# Patient Record
Sex: Female | Born: 1990 | Race: White | Hispanic: No | Marital: Married | Smoking: Current every day smoker
Health system: Southern US, Community
[De-identification: ages and names within clinical notes are randomized; demographics above are authoritative.]

## PROBLEM LIST (undated history)

## (undated) DIAGNOSIS — F419 Anxiety disorder, unspecified: Secondary | ICD-10-CM

## (undated) DIAGNOSIS — G35 Multiple sclerosis: Secondary | ICD-10-CM

## (undated) DIAGNOSIS — F429 Obsessive-compulsive disorder, unspecified: Secondary | ICD-10-CM

## (undated) DIAGNOSIS — M419 Scoliosis, unspecified: Secondary | ICD-10-CM

## (undated) HISTORY — PX: BREAST ENHANCEMENT SURGERY: SHX7

## (undated) HISTORY — DX: Anxiety disorder, unspecified: F41.9

## (undated) HISTORY — DX: Scoliosis, unspecified: M41.9

## (undated) HISTORY — DX: Obsessive-compulsive disorder, unspecified: F42.9

## (undated) HISTORY — DX: Multiple sclerosis: G35

---

## 2010-05-08 HISTORY — PX: BREAST ENHANCEMENT SURGERY: SHX7

## 2011-01-27 ENCOUNTER — Inpatient Hospital Stay (HOSPITAL_COMMUNITY)
Admission: AD | Admit: 2011-01-27 | Discharge: 2011-01-27 | Disposition: A | Discharge status: Home / Self Care | Payer: Self-pay | Source: Ambulatory Visit | Attending: Obstetrics and Gynecology | Admitting: Obstetrics and Gynecology

## 2011-01-27 ENCOUNTER — Encounter: Payer: Self-pay | Admitting: Emergency Medicine

## 2011-01-27 ENCOUNTER — Emergency Department (HOSPITAL_BASED_OUTPATIENT_CLINIC_OR_DEPARTMENT_OTHER)
Admission: EM | Admit: 2011-01-27 | Discharge: 2011-01-27 | Disposition: A | Discharge status: Other Acute Inpatient Hospital | Payer: Self-pay | Attending: Emergency Medicine | Admitting: Emergency Medicine

## 2011-01-27 ENCOUNTER — Inpatient Hospital Stay (HOSPITAL_COMMUNITY): Payer: Self-pay

## 2011-01-27 DIAGNOSIS — R102 Pelvic and perineal pain: Secondary | ICD-10-CM

## 2011-01-27 DIAGNOSIS — J45909 Unspecified asthma, uncomplicated: Secondary | ICD-10-CM | POA: Insufficient documentation

## 2011-01-27 DIAGNOSIS — N83209 Unspecified ovarian cyst, unspecified side: Secondary | ICD-10-CM

## 2011-01-27 DIAGNOSIS — F17200 Nicotine dependence, unspecified, uncomplicated: Secondary | ICD-10-CM | POA: Insufficient documentation

## 2011-01-27 DIAGNOSIS — N949 Unspecified condition associated with female genital organs and menstrual cycle: Secondary | ICD-10-CM | POA: Insufficient documentation

## 2011-01-27 DIAGNOSIS — N83201 Unspecified ovarian cyst, right side: Secondary | ICD-10-CM | POA: Diagnosis present

## 2011-01-27 DIAGNOSIS — R109 Unspecified abdominal pain: Secondary | ICD-10-CM | POA: Insufficient documentation

## 2011-01-27 LAB — BASIC METABOLIC PANEL
BUN: 11 mg/dL (ref 6–23)
CO2: 28 mEq/L (ref 19–32)
Calcium: 9.3 mg/dL (ref 8.4–10.5)
Chloride: 102 mEq/L (ref 96–112)
Creatinine, Ser: 0.5 mg/dL (ref 0.50–1.10)
GFR calc Af Amer: >60 mL/min (ref >60)
GFR calc non Af Amer: >60 mL/min (ref >60)
Glucose, Bld: 87 mg/dL (ref 70–99)
Potassium: 4 mEq/L (ref 3.5–5.1)
Sodium: 137 mEq/L (ref 135–145)

## 2011-01-27 LAB — WET PREP, GENITAL
Clue Cells Wet Prep HPF POC: NONE SEEN
Trich, Wet Prep: NONE SEEN
Yeast Wet Prep HPF POC: NONE SEEN

## 2011-01-27 LAB — CBC
HCT: 38.2 % (ref 36.0–46.0)
Hemoglobin: 13.1 g/dL (ref 12.0–15.0)
MCH: 30.1 pg (ref 26.0–34.0)
MCHC: 34.3 g/dL (ref 30.0–36.0)
MCV: 87.8 fL (ref 78.0–100.0)
Platelets: 200 10*3/uL (ref 150–400)
RBC: 4.35 MIL/uL (ref 3.87–5.11)
RDW: 11.9 % (ref 11.5–15.5)
WBC: 10.3 10*3/uL (ref 4.0–10.5)

## 2011-01-27 LAB — URINALYSIS, ROUTINE W REFLEX MICROSCOPIC
Bilirubin Urine: NEGATIVE
Glucose, UA: NEGATIVE mg/dL
Hgb urine dipstick: NEGATIVE
Ketones, ur: NEGATIVE mg/dL
Leukocytes, UA: NEGATIVE
Nitrite: NEGATIVE
Protein, ur: NEGATIVE mg/dL
Specific Gravity, Urine: 1.025 (ref 1.005–1.030)
Urobilinogen, UA: 0.2 mg/dL (ref 0.0–1.0)
pH: 6 (ref 5.0–8.0)

## 2011-01-27 LAB — PREGNANCY, URINE: Preg Test, Ur: NEGATIVE

## 2011-01-27 MED ORDER — OXYCODONE-ACETAMINOPHEN 5-325 MG PO TABS: 1.0000 | ORAL_TABLET | Freq: Four times a day (QID) | ORAL | Status: AC | PRN

## 2011-01-27 MED ORDER — IBUPROFEN 200 MG PO TABS: 800.0000 mg | ORAL_TABLET | Freq: Three times a day (TID) | ORAL | Status: DC | PRN

## 2011-01-27 MED ORDER — OXYCODONE-ACETAMINOPHEN 5-325 MG PO TABS
1.0000 | ORAL_TABLET | Freq: Once | ORAL | Status: AC
  Administered 2011-01-27: 1 via ORAL
  Filled 2011-01-27: qty 1

## 2011-01-27 NOTE — ED Provider Notes (Signed)
Pt sent from [facility] by Dr. Karma Ganja for u/s. Was fully evaluated in ED there. Requested u/s to r/o torsion, no u/s available there overnight. Pt presents with acute onset of right sided pelvic pain which awoke her out of sleep tonight approx 2am. Pain was initially 10/10, took to advil and pain has decreased to 6/10. No fever, no vomiting. Had not been having any pain prior to tonight. Denies vaginal discharge, no vaginal bleeding. No dysuria. Pain radiates to right lower back. Pain has decreased, but comes in waves. LMP "about 1 month ago", periods irregular, stopped OCPs about 1.5 months ago.   O: VSS, no acute distress, A&O x 4, MSE complete  US Transvaginal Non-ob  01/27/2011  *RADIOLOGY REPORT*  Clinical Data: Right adnexal tenderness.  TRANSABDOMINAL AND TRANSVAGINAL ULTRASOUND OF PELVIS Technique:  Both transabdominal and transvaginal ultrasound examinations of the pelvis were performed. Transabdominal technique was performed for global imaging of the pelvis including uterus, ovaries, adnexal regions, and pelvic cul-de-sac.  Comparison: None.   It was necessary to proceed with endovaginal exam following the transabdominal exam to visualize the endometrium and adnexa.  Findings:  Uterus: Normal sonographic appearance, measuring 7.8 x 3.3 x 4.4 centimeters.  Endometrium: Normal sonographic appearance, measuring 11 mm in thickness.  Right ovary:  Measures 6.4 x 3.3 x 3.7 cm, containing a 4.1 x 2.8 x 3.4 cm cyst with internal lace-like echoes. Otherwise normal sonographic appearance.  Left ovary: Normal sonographic appearance, measuring 4.2 x 1.6 x 2.4 cm.  Other findings: There is a small amount of free fluid within the right adnexa and pelvis.  IMPRESSION: 4.1 cm right ovarian hemorrhagic cyst, with a small amount of free fluid within the right adnexa and pelvis.  Original Report Authenticated By: Waneta Martins, M.D.   US Pelvis Complete  01/27/2011  *RADIOLOGY REPORT*  Clinical Data:  Right adnexal tenderness.  TRANSABDOMINAL AND TRANSVAGINAL ULTRASOUND OF PELVIS Technique:  Both transabdominal and transvaginal ultrasound examinations of the pelvis were performed. Transabdominal technique was performed for global imaging of the pelvis including uterus, ovaries, adnexal regions, and pelvic cul-de-sac.  Comparison: None.   It was necessary to proceed with endovaginal exam following the transabdominal exam to visualize the endometrium and adnexa.  Findings:  Uterus: Normal sonographic appearance, measuring 7.8 x 3.3 x 4.4 centimeters.  Endometrium: Normal sonographic appearance, measuring 11 mm in thickness.  Right ovary:  Measures 6.4 x 3.3 x 3.7 cm, containing a 4.1 x 2.8 x 3.4 cm cyst with internal lace-like echoes. Otherwise normal sonographic appearance.  Left ovary: Normal sonographic appearance, measuring 4.2 x 1.6 x 2.4 cm.  Other findings: There is a small amount of free fluid within the right adnexa and pelvis.  IMPRESSION: 4.1 cm right ovarian hemorrhagic cyst, with a small amount of free fluid within the right adnexa and pelvis.  Original Report Authenticated By: Waneta Martins, M.D.    A/P: Right ovarian cyst - 4 cm F/U in GYN clinic in 4-6 weeks, repeat u/s in 6-8 weeks per Dr. Jolayne Panther Rev'd precautions Rx Percocet and Motrin

## 2011-01-27 NOTE — Progress Notes (Signed)
Pt woke up with sharp stabbing RLQ pain this am.  Pt was seen at [facility] and sent to [facility] for U/S.

## 2011-01-27 NOTE — ED Notes (Signed)
Pt reports awaking with RLQ abd pain radiating to right flank. Denies n/v/d.

## 2011-01-27 NOTE — ED Provider Notes (Addendum)
History     CSN: [id number] Arrival date & time: 01/27/2011  3:48 AM  Chief Complaint  Patient presents with  . Abdominal Cramping    HPI  (Consider location/radiation/quality/duration/timing/severity/associated sxs/prior treatment)  HPI Pt presents with acute onset of right sided pelvic pain which awoke her out of sleep tonight approx 2am. Pain was initially 10/10, took to advil and pain has decreased to 6/10.  No fever, no vomiting.  Had not been having any pain prior to tonight.  Denies vaginal discharge, no vaginal bleeding. No dysuria.  Pain radiates to right lower back.   Past Medical History  Diagnosis Date  . Asthma     History reviewed. No pertinent past surgical history.  No family history on file.  History  Substance Use Topics  . Smoking status: Current Everyday Smoker  . Smokeless tobacco: Not on file  . Alcohol Use: No    OB History    Grav Para Term Preterm Abortions TAB SAB Ect Mult Living                  Review of Systems  Review of Systems ROS reviewed and otherwise negative except for mentioned in HPI   Allergies  Review of patient's allergies indicates no known allergies.  Home Medications  No current outpatient prescriptions on file.  Physical Exam    BP 115/79  Pulse 83  Temp(Src) 98 F (36.7 C) (Oral)  Resp 18  SpO2 100%  LMP 12/27/2010 Vital signs reviewed by me Physical Exam Physical Examination: General appearance - alert, well appearing, and in no distress Mental status - alert, oriented to person, place, and time Eyes - no conjunctival injection Chest - clear to auscultation, no wheezes, rales or rhonchi, symmetric air entry Heart - normal rate, regular rhythm, normal S1, S2, no murmurs, rubs, clicks or gallops Abdomen - soft, nondistended, ttp in right pelvis, normal active bowel sounds, no rebound tenderness noted bowel sounds normal  Pelvic - VULVA: normal appearing vulva with no masses, tenderness or lesions, VAGINA:  normal appearing vagina with normal color. Scant white vaginal discharge, no lesions, CERVIX: normal appearing cervix without discharge or lesions, UTERUS: uterus is normal size, shape, consistency and nontender, ADNEXA: normal adnexa in size, nontender and no masses except adnexal tenderness right Neurological - alert, oriented, normal speech, no focal findings or movement disorder noted Extremities - peripheral pulses normal, no pedal edema, no clubbing or cyanosis Skin - normal coloration and turgor, no rashes, no suspicious skin lesions noted   ED Course  Procedures (including critical care time)   Labs Reviewed  URINALYSIS, ROUTINE W REFLEX MICROSCOPIC  PREGNANCY, URINE   No results found. CBC, chem 7, wet prep all obtained and reassuring   No diagnosis found.   MDM Pt with acute onset of right pelvic pain, some right adnexal tenderness on pelvic exam.  Pain was 10/10 at onset, now 3/10 after ibuprofen at home.  Urine reveals no signs of UTI, no RBCs to suggest ureteral stone, upreg negative.  Pt will need pelvic ultrasound to r/o torsion.  Discussed with patient and they are agreeable with plan.  Contacting women[facility] now to arrange for patient to go for ultrasound- as not available in this ED overnight 4:37 AM    Contacted [facility], they advise [facility] to go to [facility], sign in at registration desk for ultrasound. Spoke to Press photographer, Dorene Grebe.  Pt agreeble with this plan and is stable for transfer. GC/Chlamydia pending.   Ethelda Chick, MD  01/27/11 0440  Ethelda Chick, MD 01/27/11 [phone]

## 2011-01-28 LAB — GC/CHLAMYDIA PROBE AMP, GENITAL
Chlamydia, DNA Probe: NEGATIVE
GC Probe Amp, Genital: NEGATIVE

## 2011-02-01 NOTE — ED Provider Notes (Signed)
Agree with above note.  Paige Davis 02/01/2011 9:10 AM

## 2011-03-09 ENCOUNTER — Encounter: Payer: Self-pay | Admitting: Obstetrics & Gynecology

## 2012-01-20 IMAGING — US US PELVIS COMPLETE
1 series · 14 of 25 positions shown · non-contrast
Comparison: None.

CLINICAL DATA: Right adnexal tenderness.

TRANSABDOMINAL AND TRANSVAGINAL ULTRASOUND OF PELVIS
TECHNIQUE: Both transabdominal and transvaginal ultrasound
examinations of the pelvis were performed. Transabdominal technique
was performed for global imaging of the pelvis including uterus,
ovaries, adnexal regions, and pelvic cul-de-sac.

[Series 1: us pelvis complete · 14 of 65 slices shown]
[im 1/65]
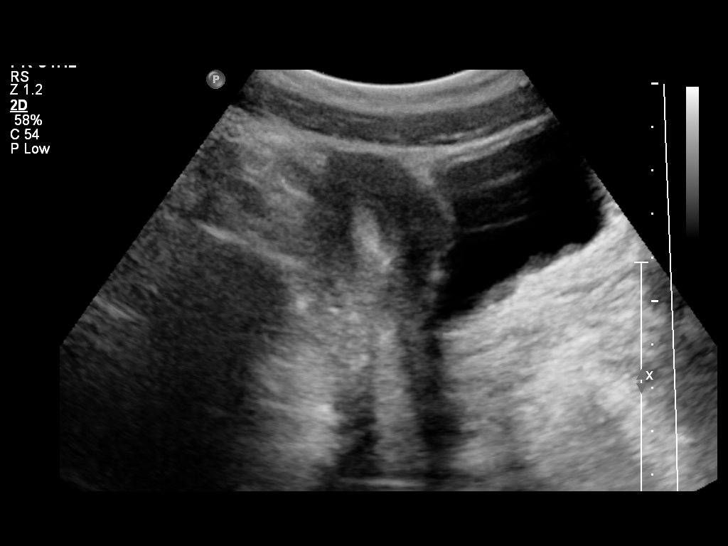
[im 6/65]
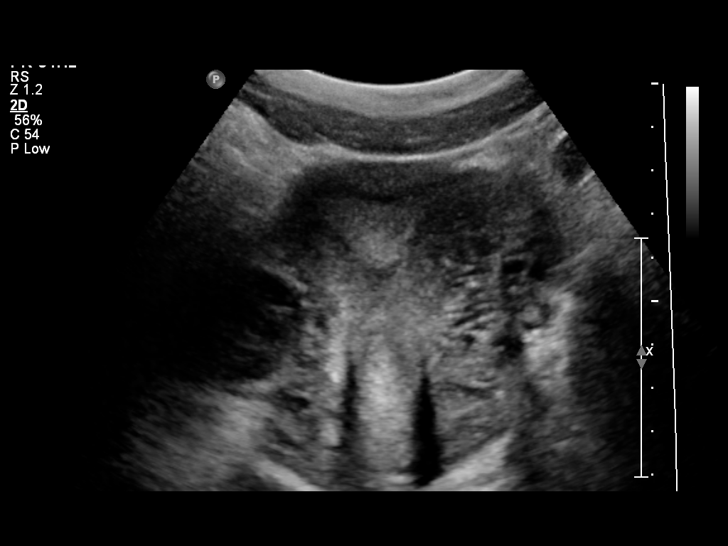
[im 11/65]
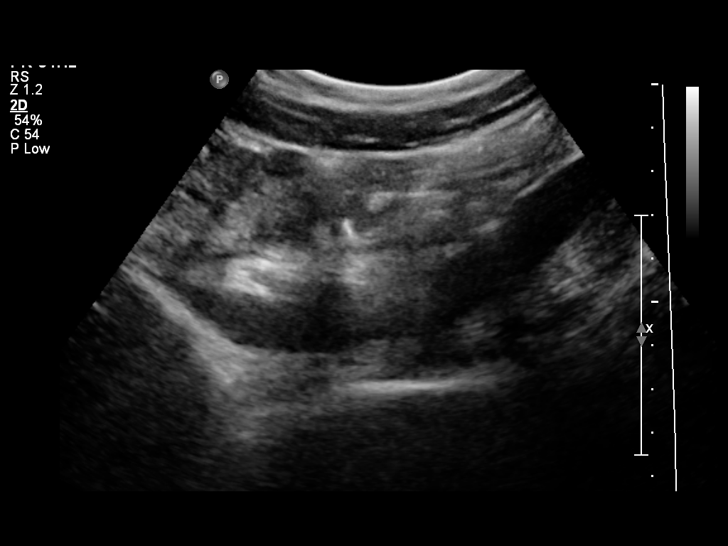
[im 17/65]
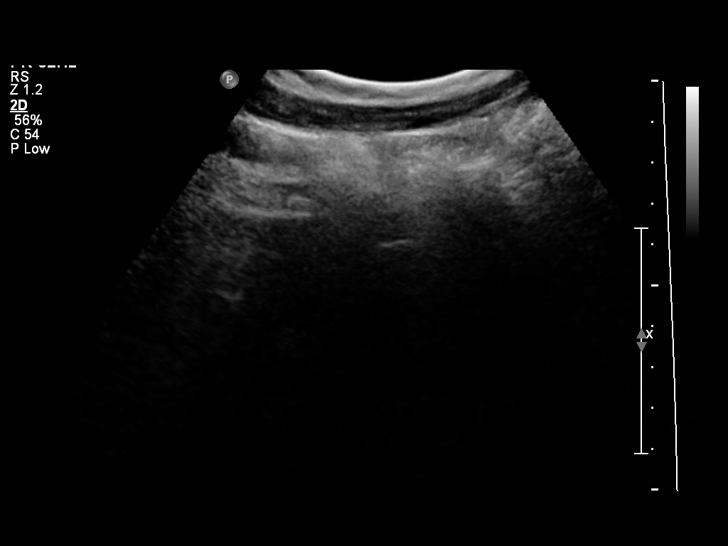
[im 22/65]
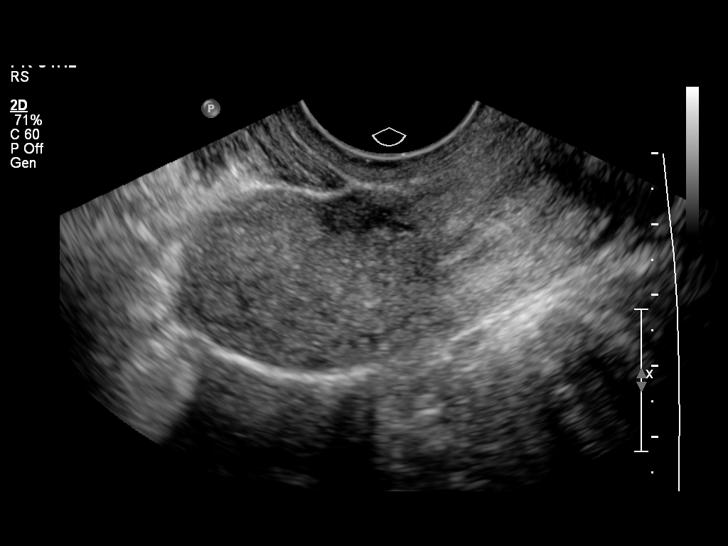
[im 25/65]
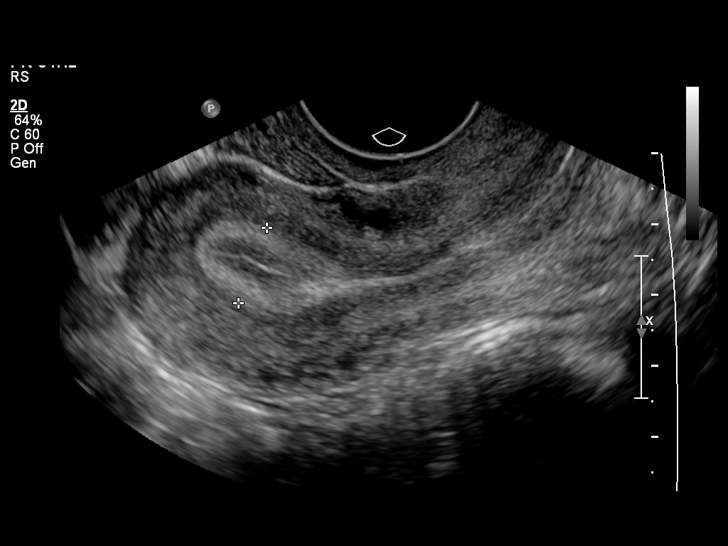
[im 30/65]
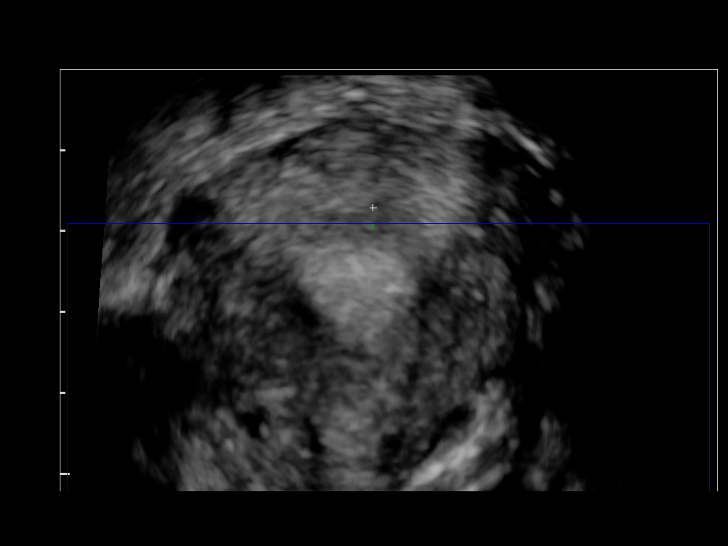
[im 35/65]
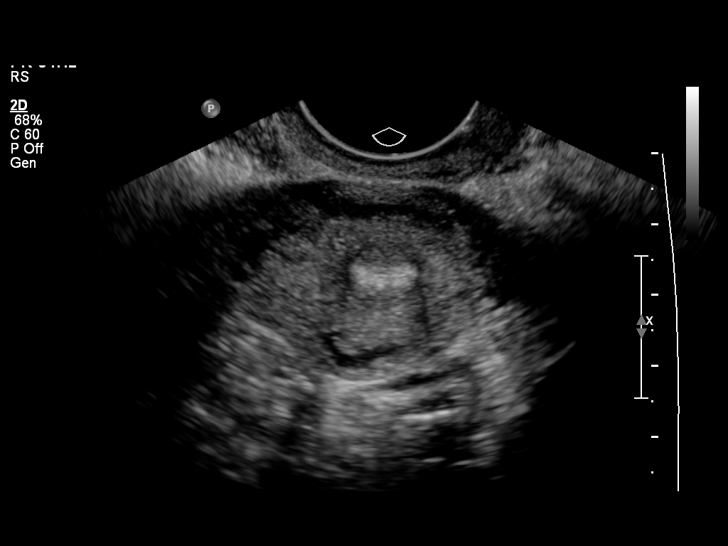
[im 41/65]
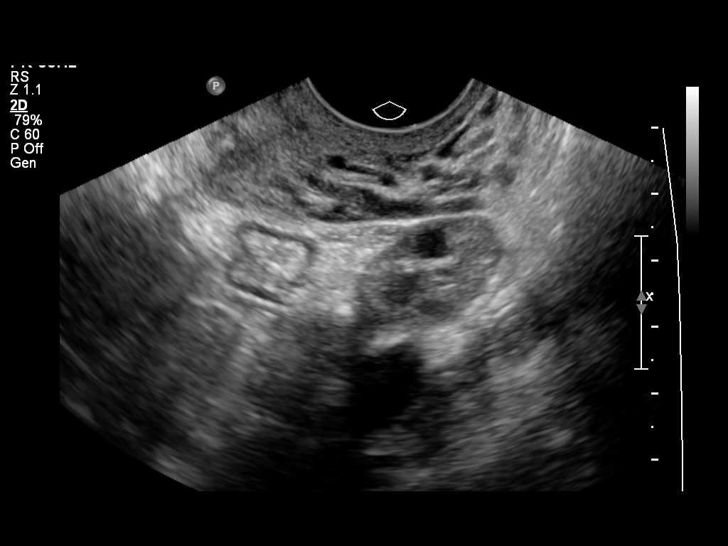
[im 43/65]
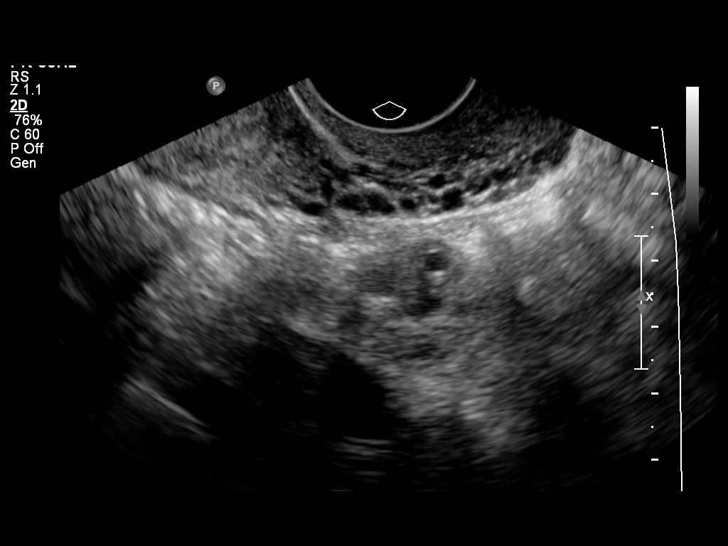
[im 49/65]
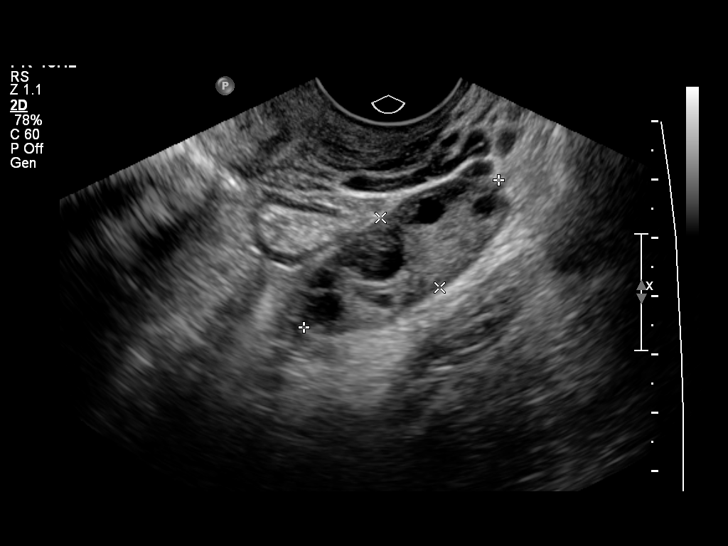
[im 54/65]
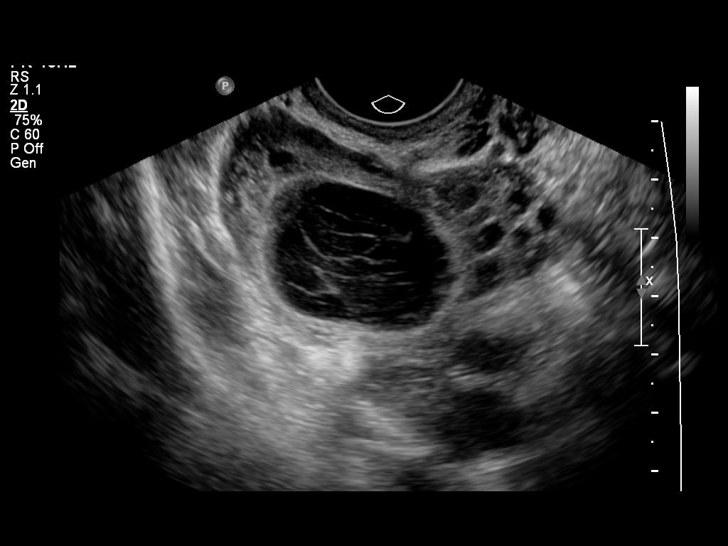
[im 59/65]
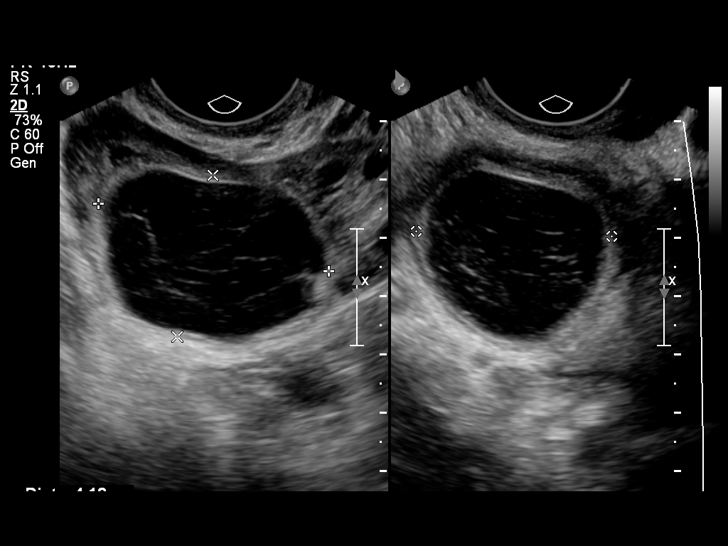
[im 65/65]
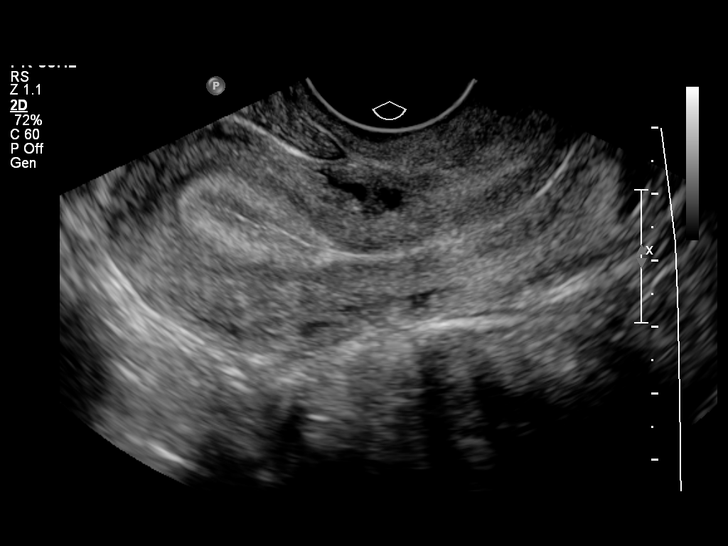

[14 of 25 positions shown; findings below may reference images not displayed]

It was necessary to proceed with endovaginal exam following the
transabdominal exam to visualize the endometrium and adnexa.
FINDINGS: Uterus: Normal sonographic appearance, measuring 7.8 x 3.3 x
centimeters.

Endometrium: Normal sonographic appearance, measuring 11 mm in
thickness.

Right ovary:  Measures 6.4 x 3.3 x 3.7 cm, containing a 4.1 x 2.8 x
3.4 cm cyst with internal lace-like echoes. Otherwise normal
sonographic appearance.

Left ovary: Normal sonographic appearance, measuring 4.2 x 1.6 x
2.4 cm.

Other findings: There is a small amount of free fluid within the
right adnexa and pelvis.
IMPRESSION: 4.1 cm right ovarian hemorrhagic cyst, with a small amount of free
fluid within the right adnexa and pelvis.

## 2016-12-24 ENCOUNTER — Emergency Department (HOSPITAL_BASED_OUTPATIENT_CLINIC_OR_DEPARTMENT_OTHER): Payer: Self-pay

## 2016-12-24 ENCOUNTER — Emergency Department (HOSPITAL_BASED_OUTPATIENT_CLINIC_OR_DEPARTMENT_OTHER)
Admission: EM | Admit: 2016-12-24 | Discharge: 2016-12-24 | Disposition: A | Discharge status: Home / Self Care | Payer: Self-pay | Attending: Emergency Medicine | Admitting: Emergency Medicine

## 2016-12-24 ENCOUNTER — Encounter (HOSPITAL_BASED_OUTPATIENT_CLINIC_OR_DEPARTMENT_OTHER): Payer: Self-pay | Admitting: Emergency Medicine

## 2016-12-24 DIAGNOSIS — F17210 Nicotine dependence, cigarettes, uncomplicated: Secondary | ICD-10-CM | POA: Insufficient documentation

## 2016-12-24 DIAGNOSIS — Y9289 Other specified places as the place of occurrence of the external cause: Secondary | ICD-10-CM | POA: Insufficient documentation

## 2016-12-24 DIAGNOSIS — Y93K1 Activity, walking an animal: Secondary | ICD-10-CM | POA: Insufficient documentation

## 2016-12-24 DIAGNOSIS — J45909 Unspecified asthma, uncomplicated: Secondary | ICD-10-CM | POA: Insufficient documentation

## 2016-12-24 DIAGNOSIS — W1842XA Slipping, tripping and stumbling without falling due to stepping into hole or opening, initial encounter: Secondary | ICD-10-CM | POA: Insufficient documentation

## 2016-12-24 DIAGNOSIS — S93491A Sprain of other ligament of right ankle, initial encounter: Secondary | ICD-10-CM | POA: Insufficient documentation

## 2016-12-24 DIAGNOSIS — Y999 Unspecified external cause status: Secondary | ICD-10-CM | POA: Insufficient documentation

## 2016-12-24 DIAGNOSIS — Z79899 Other long term (current) drug therapy: Secondary | ICD-10-CM | POA: Insufficient documentation

## 2016-12-24 MED ORDER — MELOXICAM 15 MG PO TABS: 15.0000 mg | ORAL_TABLET | Freq: Every day | ORAL | 0 refills | Status: DC

## 2016-12-24 NOTE — Discharge Instructions (Signed)
Contact a health care provider if: °You have rapidly increasing bruising or swelling. °Your pain is not relieved with medicine. °Get help right away if: °Your toes or foot becomes numb or blue. °You have severe pain that gets worse. °

## 2016-12-24 NOTE — ED Triage Notes (Signed)
Patient states that he dog got out and she was "emotionally walking"  - the patient reports that she tripped and fell on Thursday. Large amount of bruising and swelling noted to her right ankle

## 2016-12-24 NOTE — ED Notes (Signed)
EDPA into room, prior to RN assessment, see PA notes, orders received and intiated.   Alert, NAD, calm, interactive, resps e/u, speaking in clear complete sentences, no dyspnea noted, skin W&D, VSS, c/o R ankle pain swelling and bruising, "pain minimal at this time", some numbness and tingling in toes, CMS/skin intact, ROM limited d/t swelling and pain, (denies: need for pain med, sob, nausea, dizziness. Family at [facility]

## 2016-12-24 NOTE — ED Provider Notes (Signed)
[facility] Provider Note   CSN: [id number] Arrival date & time: 12/24/16  1833     History   Chief Complaint Chief Complaint  Patient presents with  . Ankle Pain    HPI Paige Davis is a 26 y.o. female who presents emergency Department with chief complaint of ankle injury. Patient states that her dog when she chased after him in the dark. She stepped into a hole with her right foot and twisted her ankle. This happened 2 days ago. Patient states that she did buy some crutches for help with ambulation, but was able to apply pressure to the ankle. Today, she did not use her crutches and her ankle became extremely bruised, swollen and painful, painful. She came in for evaluation. She denies numbness or tingling in the toes, pain in the right lower extremity or knee. She did not hit her head or lose consciousness.  HPI  Past Medical History:  Diagnosis Date  . Asthma     Patient Active Problem List   Diagnosis Date Noted  . Right ovarian cyst 01/27/2011    History reviewed. No pertinent surgical history.  OB History    No data available       Home Medications    Prior to Admission medications   Medication Sig Start Date End Date Taking? Authorizing Provider  ibuprofen (ADVIL,MOTRIN) 200 MG tablet Take 4 tablets (800 mg total) by mouth every 8 (eight) hours as needed for pain. For pain 01/27/11   Archie Patten, CNM    Family History History reviewed. No pertinent family history.  Social History Social History  Substance Use Topics  . Smoking status: Current Every Day Smoker  . Smokeless tobacco: Never Used  . Alcohol use No     Allergies   Patient has no known allergies.   Review of Systems Review of Systems Ten systems reviewed and are negative for acute change, except as noted in the HPI.    Physical Exam Updated Vital Signs BP 119/68 (BP Location: Right Arm)   Pulse 80   Temp 98 F (36.7 C) (Oral)   Resp 16   Ht 5\' 9"  (1.753  m)   Wt 68 kg (150 lb)   LMP 12/24/2016   SpO2 97%   BMI 22.15 kg/m   Physical Exam  Constitutional: She is oriented to person, place, and time. She appears well-developed and well-nourished. No distress.  HENT:  Head: Normocephalic and atraumatic.  Eyes: Conjunctivae are normal. No scleral icterus.  Neck: Normal range of motion.  Cardiovascular: Normal rate, regular rhythm and normal heart sounds.  Exam reveals no gallop and no friction rub.   No murmur heard. Pulmonary/Chest: Effort normal and breath sounds normal. No respiratory distress.  Abdominal: Soft. Bowel sounds are normal. She exhibits no distension and no mass. There is no tenderness. There is no guarding.  Musculoskeletal:  Patient with extensive bruising, swelling and tenderness posterior to the right malleolus. No bony tenderness along the foot. Able to wiggle toes. No medial ankle tenderness. Normal pulses and sensation.  Neurological: She is alert and oriented to person, place, and time.  Skin: Skin is warm and dry. She is not diaphoretic.  Psychiatric: Her behavior is normal.  Nursing note and vitals reviewed.    ED Treatments / Results  Labs (all labs ordered are listed, but only abnormal results are displayed) Labs Reviewed - No data to display  EKG  EKG Interpretation None       Radiology Dg  Ankle Complete Right  Result Date: 12/24/2016 CLINICAL DATA:  Fall 2 days ago with persistent ankle pain, initial encounter EXAM: RIGHT ANKLE - COMPLETE 3+ VIEW COMPARISON:  None. FINDINGS: No acute fracture or dislocation is noted. Lateral soft tissue swelling is seen. IMPRESSION: Soft tissue swelling without acute bony abnormality. Electronically Signed   By: Alcide Clever M.D.   On: 12/24/2016 19:11    Procedures Procedures (including critical care time)  Medications Ordered in ED Medications - No data to display   Initial Impression / Assessment and Plan / ED Course  I have reviewed the triage vital  signs and the nursing notes.  Pertinent labs & imaging results that were available during my care of the patient were reviewed by me and considered in my medical decision making (see chart for details).     Paige Davis is a 26 y.o. female who presents to ED for R ankle pain after mechanical fall. LE NVI. Exam c/w ankle sprain. X-ray negative for acute injury. Cam walker brace and crutches provided in ED. Home care instructions including RICE and NSAID's discussed. Follow up with sports medicine/ ortho if symptoms not improving in 1 week. All questions answered.    Final Clinical Impressions(s) / ED Diagnoses   Final diagnoses:  Sprain of posterior talofibular ligament of right ankle, initial encounter    New Prescriptions New Prescriptions   No medications on file     Delos Haring 12/24/16 2100    Mesner, Barbara Cower, MD 12/24/16 2336

## 2019-07-07 DIAGNOSIS — N3289 Other specified disorders of bladder: Secondary | ICD-10-CM

## 2019-07-07 HISTORY — DX: Other specified disorders of bladder: N32.89

## 2020-02-16 ENCOUNTER — Encounter: Payer: Self-pay | Admitting: Diagnostic Neuroimaging

## 2020-02-16 ENCOUNTER — Ambulatory Visit (INDEPENDENT_AMBULATORY_CARE_PROVIDER_SITE_OTHER): Payer: No Typology Code available for payment source | Admitting: Diagnostic Neuroimaging

## 2020-02-16 ENCOUNTER — Other Ambulatory Visit: Payer: Self-pay

## 2020-02-16 VITALS — BP 119/83 | HR 103 | Ht 68.0 in | Wt 144.2 lb

## 2020-02-16 DIAGNOSIS — M6281 Muscle weakness (generalized): Secondary | ICD-10-CM | POA: Diagnosis not present

## 2020-02-16 NOTE — Patient Instructions (Signed)
-   check MRI cervical / lumbar spine - check labs

## 2020-02-16 NOTE — Progress Notes (Signed)
[facility]  PATIENT: Paige Davis DOB: 1990-07-13  REFERRING CLINICIAN: Lucia Bitter, MD HISTORY FROM: patient  REASON FOR VISIT: new consult    HISTORICAL  CHIEF COMPLAINT:  Chief Complaint  Patient presents with  . Bilateral leg pain, weakness    rm 6 New Pt  "since March 2021- my legs don't work, lock up, give out, get weak; fingers/toes go numb; bladder spasms"    HISTORY OF PRESENT ILLNESS:   29 year old female here for evaluation of lower extremity weakness.  March 2021 patient was having some difficulty initiating urination, diagnosed with UTIs and treated. She was also having generalized fatigue, muscle weakness in legs, aching sensation in legs. Patient got married in April 2021 and symptoms continued. She was having continuing weakness in the legs and gait difficulty. She was having intermittent numbness in fingers and toes.  Patient went to chiropractor and had some treatments. Patient referred here for further neurologic testing and evaluation.   REVIEW OF SYSTEMS: Full 14 system review of systems performed and negative with exception of: As per HPI.  ALLERGIES: No Known Allergies  HOME MEDICATIONS: Outpatient Medications Prior to Visit  Medication Sig Dispense Refill  . acetaminophen (TYLENOL) 325 MG tablet Take 650 mg by mouth every 6 (six) hours as needed.    . ALPRAZolam (XANAX) 0.5 MG tablet Take 0.5 mg by mouth as needed for anxiety.    . Homeopathic Products (AZO CONFIDENCE PO) Take by mouth.    . Ibuprofen-diphenhydrAMINE Cit (ADVIL PM PO) Take by mouth.    . Multiple Vitamin (MULTIVITAMIN) tablet Take 1 tablet by mouth daily.    . Naproxen Sodium (ALEVE PO) Take by mouth.    . Probiotic Product (PROBIOTIC DAILY PO) Take by mouth.    . meloxicam (MOBIC) 15 MG tablet Take 1 tablet (15 mg total) by mouth daily. Take 1 daily with food. (Patient not taking: Reported on 02/16/2020) 10 tablet 0  . ibuprofen (ADVIL,MOTRIN) 200  MG tablet Take 4 tablets (800 mg total) by mouth every 8 (eight) hours as needed for pain. For pain 30 tablet 1   No facility-administered medications prior to visit.    PAST MEDICAL HISTORY: Past Medical History:  Diagnosis Date  . Anxiety   . Asthma   . Bladder spasms 07/2019  . Scoliosis     PAST SURGICAL HISTORY: Past Surgical History:  Procedure Laterality Date  . BREAST ENHANCEMENT SURGERY      FAMILY HISTORY: Family History  Problem Relation Age of Onset  . Arthritis Mother     SOCIAL HISTORY: Social History   Socioeconomic History  . Marital status: Married    Spouse name: Arlys John  . Number of children: 0  . Years of education: Not on file  . Highest education level: GED or equivalent  Occupational History    Comment: real estate agent  Tobacco Use  . Smoking status: Current Every Day Smoker    Years: 1.00  . Smokeless tobacco: Never Used  Substance and Sexual Activity  . Alcohol use: Yes    Comment: occas  . 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 Sexual activity: Not on file  Other Topics Concern  . Not on file  Social History Narrative   Lives with husband, step children   Caffeine- not daily   Social Determinants of Health   Financial Resource Strain:   . Difficulty of Paying Living Expenses: Not on file  Food Insecurity:   . Worried  About Running Out of Food in the Last Year: Not on file  . Ran Out of Food in the Last Year: Not on file  Transportation Needs:   . Lack of Transportation (Medical): Not on file  . Lack of Transportation (Non-Medical): Not on file  Physical Activity:   . Days of Exercise per Week: Not on file  . Minutes of Exercise per Session: Not on file  Stress:   . Feeling of Stress : Not on file  Social Connections:   . Frequency of Communication with Friends and Family: Not on file  . Frequency of Social Gatherings with Friends and Family: Not on file  . Attends Religious Services: Not on file  . Active  Member of Clubs or Organizations: Not on file  . Attends Banker Meetings: Not on file  . Marital Status: Not on file  Intimate Partner Violence:   . Fear of Current or Ex-Partner: Not on file  . Emotionally Abused: Not on file  . Physically Abused: Not on file  . Sexually Abused: Not on file     PHYSICAL EXAM  GENERAL EXAM/CONSTITUTIONAL: Vitals:  Vitals:   02/16/20 1130  BP: 119/83  Pulse: (!) 103  Weight: 144 lb 3.2 oz (65.4 kg)  Height: 5\' 8"  (1.727 m)     Body mass index is 21.93 kg/m. Wt Readings from Last 3 Encounters:  02/16/20 144 lb 3.2 oz (65.4 kg)  12/24/16 150 lb (68 kg)  01/27/11 135 lb (61.2 kg)     Patient is in no distress; well developed, nourished and groomed; neck is supple  CARDIOVASCULAR:  Examination of carotid arteries is normal; no carotid bruits  Regular rate and rhythm, no murmurs  Examination of peripheral vascular system by observation and palpation is normal  EYES:  Ophthalmoscopic exam of optic discs and posterior segments is normal; no papilledema or hemorrhages  No exam data present  MUSCULOSKELETAL:  Gait, strength, tone, movements noted in Neurologic exam below  NEUROLOGIC: MENTAL STATUS:  No flowsheet data found.  awake, alert, oriented to person, place and time  recent and remote memory intact  normal attention and concentration  language fluent, comprehension intact, naming intact  fund of knowledge appropriate  CRANIAL NERVE:   2nd - no papilledema on fundoscopic exam  2nd, 3rd, 4th, 6th - pupils equal and reactive to light, visual fields full to confrontation, extraocular muscles intact, no nystagmus  5th - facial sensation symmetric  7th - facial strength symmetric  8th - hearing intact  9th - palate elevates symmetrically, uvula midline  11th - shoulder shrug symmetric  12th - tongue protrusion midline  MOTOR:   normal bulk and tone, full strength in the BUE, BLE; EXCEPT 4/5  IN BLE  SENSORY:   normal and symmetric to light touch, temperature, vibration  COORDINATION:   finger-nose-finger, fine finger movements normal  REFLEXES:   deep tendon reflexes BRISK and symmetric  GAIT/STATION:   narrow based gait; CAUTIOUS     DIAGNOSTIC DATA (LABS, IMAGING, TESTING) - I reviewed patient records, labs, notes, testing and imaging myself where available.  Lab Results  Component Value Date   WBC 10.3 01/27/2011   HGB 13.1 01/27/2011   HCT 38.2 01/27/2011   MCV 87.8 01/27/2011   PLT 200 01/27/2011      Component Value Date/Time   NA 137 01/27/2011 0410   K 4.0 01/27/2011 0410   CL 102 01/27/2011 0410   CO2 28 01/27/2011 0410   GLUCOSE 87  01/27/2011 0410   BUN 11 01/27/2011 0410   CREATININE 0.50 01/27/2011 0410   CALCIUM 9.3 01/27/2011 0410   GFRNONAA >60 01/27/2011 0410   GFRAA >60 01/27/2011 0410   No results found for: CHOL, HDL, LDLCALC, LDLDIRECT, TRIG, CHOLHDL No results found for: LEXN1Z No results found for: VITAMINB12 No results found for: TSH   01/27/20 CBC, CMP - nl a1c 4.6    ASSESSMENT AND PLAN  29 y.o. year old female here with lower extremity weakness, difficulty with urination, numbness in hands and feet. We will proceed with further work-up.  Dx:  1. Muscle weakness      PLAN:  LOWER EXTREMITY WEAKNESS / BLADDER SPASM / GAIT DIFFICULTY / HYPERREFLEXIA - check MRI cervical (rule out demyelinating disease)  / lumbar spine (rule out cauda equina syndrome) - check labs - then consider EMG/NCS, MRI brain and thoracic in future - use cane / walker; consider PT evaluation after workup  Orders Placed This Encounter  Procedures  . MR CERVICAL SPINE W WO CONTRAST  . MR Lumbar Spine W Wo Contrast  . Vitamin B12  . CK  . Aldolase  . TSH   Return in about 6 months (around 08/16/2020).    Suanne Marker, MD 02/16/2020, 11:53 AM Certified in Neurology, Neurophysiology and Neuroimaging  [facility] [address], [address] [place], [place] [address] [phone]

## 2020-02-17 ENCOUNTER — Telehealth: Payer: Self-pay | Admitting: Diagnostic Neuroimaging

## 2020-02-17 NOTE — Telephone Encounter (Signed)
no to the covid questions MR Cervical spine w/wo contrast & MR Lumbar spine w/wo contrast Dr. Marjory Lies [facility] Renette Butters rule Berkley Harvey: NPR Ref # Everardo Beals on 02/16/20). Patient is scheduled at [facility] for 02/18/20.

## 2020-02-18 ENCOUNTER — Ambulatory Visit: Payer: No Typology Code available for payment source

## 2020-02-18 ENCOUNTER — Other Ambulatory Visit: Payer: Self-pay

## 2020-02-18 ENCOUNTER — Encounter: Payer: Self-pay | Admitting: *Deleted

## 2020-02-18 DIAGNOSIS — G379 Demyelinating disease of central nervous system, unspecified: Secondary | ICD-10-CM

## 2020-02-18 DIAGNOSIS — M6281 Muscle weakness (generalized): Secondary | ICD-10-CM

## 2020-02-18 LAB — ALDOLASE: Aldolase: 2.9 U/L — ABNORMAL LOW (ref 3.3–10.3)

## 2020-02-18 LAB — VITAMIN B12: Vitamin B-12: 379 pg/mL (ref 232–1245)

## 2020-02-18 LAB — TSH: TSH: 2.59 u[IU]/mL (ref 0.450–4.500)

## 2020-02-18 LAB — CK: Total CK: 71 U/L (ref 32–182)

## 2020-02-18 MED ORDER — GADOBENATE DIMEGLUMINE 529 MG/ML IV SOLN
15.0000 mL | Freq: Once | INTRAVENOUS | Status: AC | PRN
  Administered 2020-02-18: 15 mL via INTRAVENOUS

## 2020-02-19 NOTE — Telephone Encounter (Signed)
I called patient to review results. Possible autoimmune / inflamm or demyelinating dz. Will proceed with MRI brain and thoracic spine, lab testing.   Orders Placed This Encounter  Procedures  . MR BRAIN W WO CONTRAST  . MR THORACIC SPINE W WO CONTRAST  . Angiotensin converting enzyme  . ANA w/Reflex  . SSA, SSB  . HIV  . RPR  . Hepatitis B surface antibody, qualitative  . Hepatitis B surface antigen  . Hepatitis B core antibody, total  . Hepatitis C antibody  . ANCA    Suanne Marker, MD 02/19/2020, 3:42 PM Certified in Neurology, Neurophysiology and Neuroimaging  [facility] [address] [place], [place] [address] [phone]

## 2020-02-23 ENCOUNTER — Encounter: Payer: Self-pay | Admitting: *Deleted

## 2020-02-23 ENCOUNTER — Telehealth: Payer: Self-pay | Admitting: *Deleted

## 2020-02-23 ENCOUNTER — Other Ambulatory Visit (INDEPENDENT_AMBULATORY_CARE_PROVIDER_SITE_OTHER): Payer: Self-pay

## 2020-02-23 DIAGNOSIS — G379 Demyelinating disease of central nervous system, unspecified: Secondary | ICD-10-CM

## 2020-02-23 DIAGNOSIS — Z0289 Encounter for other administrative examinations: Secondary | ICD-10-CM

## 2020-02-23 NOTE — Telephone Encounter (Signed)
Pt p/u copy of her Cd on 02/23/20

## 2020-02-24 ENCOUNTER — Telehealth: Payer: Self-pay | Admitting: Diagnostic Neuroimaging

## 2020-02-24 ENCOUNTER — Ambulatory Visit (INDEPENDENT_AMBULATORY_CARE_PROVIDER_SITE_OTHER): Payer: No Typology Code available for payment source

## 2020-02-24 DIAGNOSIS — G379 Demyelinating disease of central nervous system, unspecified: Secondary | ICD-10-CM

## 2020-02-24 MED ORDER — GADOBENATE DIMEGLUMINE 529 MG/ML IV SOLN
15.0000 mL | Freq: Once | INTRAVENOUS | Status: AC | PRN
  Administered 2020-02-24: 15 mL via INTRAVENOUS

## 2020-02-24 NOTE — Telephone Encounter (Signed)
no to the covid questions MR Brain w/wo contrast & MR Thoracic spine w/wo contrast Dr. Marjory Lies [facility] Renette Butters Rule Auth: NPR Ref # Shelba Flake on 02/24/20 the patient is scheduled at [facility] [place] for 02/24/20.

## 2020-02-24 NOTE — Telephone Encounter (Signed)
Called patient who saw reply to her question on my chart I sent. She is waiting now for her MRI's . She understands other labs are pending. She verbalized understanding, appreciation for call.

## 2020-02-24 NOTE — Telephone Encounter (Signed)
Pt called wanting to speak to RN regarding her lab results she has received on MyChart. Please advise.

## 2020-02-25 ENCOUNTER — Telehealth: Payer: Self-pay | Admitting: *Deleted

## 2020-02-25 ENCOUNTER — Encounter: Payer: Self-pay | Admitting: *Deleted

## 2020-02-25 DIAGNOSIS — G35 Multiple sclerosis: Secondary | ICD-10-CM

## 2020-02-25 LAB — HEPATITIS B SURFACE ANTIGEN: Hepatitis B Surface Ag: NEGATIVE

## 2020-02-25 LAB — HEPATITIS B CORE ANTIBODY, TOTAL: Hep B Core Total Ab: NEGATIVE

## 2020-02-25 LAB — HEPATITIS C ANTIBODY: Hep C Virus Ab: 0.1 s/co ratio (ref 0.0–0.9)

## 2020-02-25 LAB — HEPATITIS B SURFACE ANTIBODY,QUALITATIVE: Hep B Surface Ab, Qual: REACTIVE

## 2020-02-25 LAB — ANA W/REFLEX: ANA Titer 1: NEGATIVE

## 2020-02-25 LAB — RPR: RPR Ser Ql: NONREACTIVE

## 2020-02-25 LAB — HIV ANTIBODY (ROUTINE TESTING W REFLEX): HIV Screen 4th Generation wRfx: NONREACTIVE

## 2020-02-25 LAB — PAN-ANCA
ANCA Proteinase 3: <3.5 U/mL (ref 0.0–3.5)
Atypical pANCA: <1:20 {titer}
C-ANCA: <1:20 {titer}
Myeloperoxidase Ab: <9 U/mL (ref 0.0–9.0)
P-ANCA: <1:20 {titer}

## 2020-02-25 LAB — ANGIOTENSIN CONVERTING ENZYME: Angio Convert Enzyme: 31 U/L (ref 14–82)

## 2020-02-25 LAB — SJOGREN'S SYNDROME ANTIBODS(SSA + SSB)
ENA SSA (RO) Ab: <0.2 AI (ref 0.0–0.9)
ENA SSB (LA) Ab: <0.2 AI (ref 0.0–0.9)

## 2020-02-25 MED ORDER — PREDNISONE 10 MG PO TABS: ORAL_TABLET | ORAL | 0 refills | Status: DC

## 2020-02-25 NOTE — Telephone Encounter (Signed)
I called patient; scans consistent with multiple sclerosis.  1 enhancing lesion noted in the brain.  Will proceed prednisone course.  Patient continued to have numbness, fatigue, muscle spasms.  We will plan to initiate high potency disease modifying therapy as patient is young and has spinal cord and brain lesions.  Will consider Tysabri versus Ocrevus. Will check additional labs.   Will setup revisit (video or in office) next 1-2 weeks to discuss further.    Meds ordered this encounter  Medications  . predniSONE (DELTASONE) 10 MG tablet    Sig: Take 60mg  on day 1. Reduce by 10mg  each subsequent day. (60, 50, 40, 30, 20, 10, stop)    Dispense:  21 tablet    Refill:  0    Orders Placed This Encounter  Procedures  . VITAMIN D 25 Hydroxy (Vit-D Deficiency, Fractures)  . Stratify JCV(TM) Ab w/Index    , MD 02/25/2020, 6:21 PM Certified in Neurology, Neurophysiology and Neuroimaging  [facility] [address] [place], [address] [place] [phone]

## 2020-02-26 NOTE — Telephone Encounter (Signed)
Called patient and reminded her of labs ordered, explained reason for labs, gave her lab hours. She prefers video visit, so we scheduled her. She will come in for labs. Patient verbalized understanding, appreciation.

## 2020-03-01 ENCOUNTER — Other Ambulatory Visit (INDEPENDENT_AMBULATORY_CARE_PROVIDER_SITE_OTHER): Payer: Self-pay

## 2020-03-01 DIAGNOSIS — Z0289 Encounter for other administrative examinations: Secondary | ICD-10-CM

## 2020-03-01 DIAGNOSIS — G35 Multiple sclerosis: Secondary | ICD-10-CM

## 2020-03-01 NOTE — Telephone Encounter (Signed)
Patient came in today and had JCV, Vitamin D25 labs drawn.

## 2020-03-01 NOTE — Telephone Encounter (Signed)
JCV lab specimen placed in [facility] lock box for pick up. 

## 2020-03-02 LAB — VITAMIN D 25 HYDROXY (VIT D DEFICIENCY, FRACTURES): Vit D, 25-Hydroxy: 31.8 ng/mL (ref 30.0–100.0)

## 2020-03-03 MED ORDER — BACLOFEN 10 MG PO TABS
5.0000 mg | ORAL_TABLET | Freq: Two times a day (BID) | ORAL | 6 refills | Status: DC | PRN
Start: 2020-03-03 — End: 2020-12-06

## 2020-03-03 NOTE — Addendum Note (Signed)
Addended by: Joycelyn Schmid R on: 03/03/2020 04:02 PM   Modules accepted: Orders

## 2020-03-03 NOTE — Telephone Encounter (Signed)
Meds ordered this encounter  Medications   baclofen (LIORESAL) 10 MG tablet    Sig: Take 0.5-1 tablets (5-10 mg total) by mouth 2 (two) times daily as needed for muscle spasms.    Dispense:  60 each    Refill:  6    Suanne Marker, MD 03/03/2020, 4:02 PM Certified in Neurology, Neurophysiology and Neuroimaging  [facility] [address] [place], [place] [address] [phone]

## 2020-03-08 ENCOUNTER — Telehealth: Payer: Self-pay | Admitting: *Deleted

## 2020-03-08 NOTE — Telephone Encounter (Signed)
Received JCV result from [facility], index value 3.15 positive. Report placed on MD's desk for review.

## 2020-03-08 NOTE — Telephone Encounter (Signed)
Routed to [employer], [place] coordinator to reach out to patient for Ocrevus start form.

## 2020-03-08 NOTE — Telephone Encounter (Signed)
Recommend to start ocrevus. -VRP

## 2020-03-10 ENCOUNTER — Encounter: Payer: Self-pay | Admitting: Diagnostic Neuroimaging

## 2020-03-10 ENCOUNTER — Telehealth (INDEPENDENT_AMBULATORY_CARE_PROVIDER_SITE_OTHER): Payer: No Typology Code available for payment source | Admitting: Diagnostic Neuroimaging

## 2020-03-10 ENCOUNTER — Telehealth: Payer: Self-pay | Admitting: Diagnostic Neuroimaging

## 2020-03-10 DIAGNOSIS — G35 Multiple sclerosis: Secondary | ICD-10-CM | POA: Diagnosis not present

## 2020-03-10 MED ORDER — DALFAMPRIDINE ER 10 MG PO TB12: 10.0000 mg | ORAL_TABLET | Freq: Two times a day (BID) | ORAL | 12 refills | Status: DC

## 2020-03-10 NOTE — Telephone Encounter (Signed)
[facility] Paige Davis) called need PA for dalfampridine 10 MG TB12.

## 2020-03-10 NOTE — Telephone Encounter (Signed)
Ocrevus start form faxed to [facility]. Received a receipt of confirmation.  Ocrevus start form and order given to MR for scanning.  Ocrevus start form, order, demos, labs, imaging, notes given to [facility] for processing. Marked urgent per Dr. Richrd Humbles request.

## 2020-03-10 NOTE — Progress Notes (Signed)
[facility]  PATIENT: Paige Davis DOB: Aug 16, 1990  REFERRING CLINICIAN: [facility] HISTORY FROM: patient  REASON FOR VISIT: follow up    HISTORICAL  CHIEF COMPLAINT:  Chief Complaint  Patient presents with   Multiple Sclerosis    HISTORY OF PRESENT ILLNESS:   UPDATE (03/10/20, VRP): Since last visit, now diagnosed with multiple sclerosis.  MRI and lab testing reviewed.  Planning to initiate Ocrevus.  Patient continues to have issues with fatigue, gait difficulty, incomplete bladder emptying.    PRIOR HPI (02/16/20):   29 year old female here for evaluation of lower extremity weakness.  March 2021 patient was having some difficulty initiating urination, diagnosed with UTIs and treated. She was also having generalized fatigue, muscle weakness in legs, aching sensation in legs. Patient got married in April 2021 and symptoms continued. She was having continuing weakness in the legs and gait difficulty. She was having intermittent numbness in fingers and toes.  Patient went to chiropractor and had some treatments. Patient referred here for further neurologic testing and evaluation.   REVIEW OF SYSTEMS: Full 14 system review of systems performed and negative with exception of: As per HPI.  ALLERGIES: No Known Allergies  HOME MEDICATIONS: Outpatient Medications Prior to Visit  Medication Sig Dispense Refill   acetaminophen (TYLENOL) 325 MG tablet Take 650 mg by mouth every 6 (six) hours as needed.     ALPRAZolam (XANAX) 0.5 MG tablet Take 0.5 mg by mouth as needed for anxiety.     baclofen (LIORESAL) 10 MG tablet Take 0.5-1 tablets (5-10 mg total) by mouth 2 (two) times daily as needed for muscle spasms. 60 each 6   Homeopathic Products (AZO CONFIDENCE PO) Take by mouth.     Ibuprofen-diphenhydrAMINE Cit (ADVIL PM PO) Take by mouth.     meloxicam (MOBIC) 15 MG tablet Take 1 tablet (15 mg total) by mouth daily. Take 1 daily with  food. (Patient not taking: Reported on 02/16/2020) 10 tablet 0   Multiple Vitamin (MULTIVITAMIN) tablet Take 1 tablet by mouth daily.     Naproxen Sodium (ALEVE PO) Take by mouth.     predniSONE (DELTASONE) 10 MG tablet Take 60mg  on day 1. Reduce by 10mg  each subsequent day. (60, 50, 40, 30, 20, 10, stop) 21 tablet 0   Probiotic Product (PROBIOTIC DAILY PO) Take by mouth.     No facility-administered medications prior to visit.    PAST MEDICAL HISTORY: Past Medical History:  Diagnosis Date   Anxiety    Asthma    Bladder spasms 07/2019   Scoliosis     PAST SURGICAL HISTORY: Past Surgical History:  Procedure Laterality Date   BREAST ENHANCEMENT SURGERY      FAMILY HISTORY: Family History  Problem Relation Age of Onset   Arthritis Mother    Multiple sclerosis Maternal Uncle     SOCIAL HISTORY: Social History   Socioeconomic History   Marital status: Married    Spouse name:   Number of children: 0   Years of education: Not on file   Highest education level: GED or equivalent  Occupational History    Comment: real estate agent  Tobacco Use   Smoking status: Current Every Day Smoker    Years: 1.00   Smokeless tobacco: Never Used  Substance and Sexual Activity   Alcohol use: Yes    Comment: occas   Drug use: Yes    Comment: 02/16/20 THC daily   Sexual activity: Not on file  Other Topics Concern  Not on file  Social History Narrative   Lives with husband, step children   Caffeine- not daily   Social Determinants of Health   Financial Resource Strain:    Difficulty of Paying Living Expenses: Not on file  Food Insecurity:    Worried About Programme researcher, broadcasting/film/video in the Last Year: Not on file   [employer] of Food in the Last Year: Not on file  Transportation Needs:    Lack of Transportation (Medical): Not on file   Lack of Transportation (Non-Medical): Not on file  Physical Activity:    Days of Exercise per Week: Not on file    Minutes of Exercise per Session: Not on file  Stress:    Feeling of Stress : Not on file  Social Connections:    Frequency of Communication with Friends and Family: Not on file   Frequency of Social Gatherings with Friends and Family: Not on file   Attends Religious Services: Not on file   Active Member of Clubs or Organizations: Not on file   Attends Banker Meetings: Not on file   Marital Status: Not on file  Intimate Partner Violence:    Fear of Current or Ex-Partner: Not on file   Emotionally Abused: Not on file   Physically Abused: Not on file   Sexually Abused: Not on file     PHYSICAL EXAM  VIDEO VISIT (below is prior exam)  GENERAL EXAM/CONSTITUTIONAL: Vitals:  There were no vitals filed for this visit. There is no height or weight on file to calculate BMI. Wt Readings from Last 3 Encounters:  02/16/20 144 lb 3.2 oz (65.4 kg)  12/24/16 150 lb (68 kg)  01/27/11 135 lb (61.2 kg)    Patient is in no distress; well developed, nourished and groomed; neck is supple  CARDIOVASCULAR:  Examination of carotid arteries is normal; no carotid bruits  Regular rate and rhythm, no murmurs  Examination of peripheral vascular system by observation and palpation is normal  EYES:  Ophthalmoscopic exam of optic discs and posterior segments is normal; no papilledema or hemorrhages No exam data present  MUSCULOSKELETAL:  Gait, strength, tone, movements noted in Neurologic exam below  NEUROLOGIC: MENTAL STATUS:  No flowsheet data found.  awake, alert, oriented to person, place and time  recent and remote memory intact  normal attention and concentration  language fluent, comprehension intact, naming intact  fund of knowledge appropriate  CRANIAL NERVE:   2nd - no papilledema on fundoscopic exam  2nd, 3rd, 4th, 6th - pupils equal and reactive to light, visual fields full to confrontation, extraocular muscles intact, no nystagmus  5th -  facial sensation symmetric  7th - facial strength symmetric  8th - hearing intact  9th - palate elevates symmetrically, uvula midline  11th - shoulder shrug symmetric  12th - tongue protrusion midline  MOTOR:   normal bulk and tone, full strength in the BUE, BLE; EXCEPT 4/5 IN BLE  SENSORY:   normal and symmetric to light touch, temperature, vibration  COORDINATION:   finger-nose-finger, fine finger movements normal  REFLEXES:   deep tendon reflexes BRISK and symmetric  GAIT/STATION:   narrow based gait; CAUTIOUS     DIAGNOSTIC DATA (LABS, IMAGING, TESTING) - I reviewed patient records, labs, notes, testing and imaging myself where available.  Lab Results  Component Value Date   WBC 10.3 01/27/2011   HGB 13.1 01/27/2011   HCT 38.2 01/27/2011   MCV 87.8 01/27/2011   PLT 200 01/27/2011  Component Value Date/Time   NA 137 01/27/2011 0410   K 4.0 01/27/2011 0410   CL 102 01/27/2011 0410   CO2 28 01/27/2011 0410   GLUCOSE 87 01/27/2011 0410   BUN 11 01/27/2011 0410   CREATININE 0.50 01/27/2011 0410   CALCIUM 9.3 01/27/2011 0410   GFRNONAA >60 01/27/2011 0410   GFRAA >60 01/27/2011 0410   No results found for: CHOL, HDL, LDLCALC, LDLDIRECT, TRIG, CHOLHDL No results found for: YYFR1M Lab Results  Component Value Date   VITAMINB12 379 02/16/2020   Lab Results  Component Value Date   TSH 2.590 02/16/2020   Vit D, 25-Hydroxy  Date Value Ref Range Status  03/01/2020 31.8 30.0 - 100.0 ng/mL Final    Comment:    Vitamin D deficiency has been defined by the Institute of Medicine and an Endocrine Society practice guideline as a level of serum 25-OH vitamin D less than 20 ng/mL (1,2). The Endocrine Society went on to further define vitamin D insufficiency as a level between 21 and 29 ng/mL (2). 1. IOM (Institute of Medicine). 2010. Dietary reference    intakes for calcium and D. [place]: The    [employer]. 2. Holick MF, Binkley  McGrath, Bischoff-Ferrari HA, et al.    Evaluation, treatment, and prevention of vitamin D    deficiency: an Endocrine Society clinical practice    guideline. JCEM. 2011 Jul; 96(7):1911-30.       01/27/20 CBC, CMP - nl a1c 4.6  02/23/20 ACE, ANA, SSA, SSB, HIV, RPR ANCA negative  02/23/20  Hep C Virus Ab 0.0 - 0.9 s/co ratio 0.1     02/23/20      Hep B Core Total Ab Negative Negative    Hepatitis B Surface Ag Negative Negative    Hep B Surface Ab, Qual Reactive     02/24/20 MRI brain (with and without) demonstrating: -Multiple periventricular, subcortical and pericallosal T2 hyperintensities, suspicious for chronic demyelinating disease. -Single left frontal subcortical enhancing lesion measuring 2 mm.  May represent acute demyelinating plaque.  02/18/20 MRI cervical spine (with and without) demonstrating: -T2 hyperintensities in the spinal cord at C3-4 and C5 levels, and within the left middle cerebellar peduncle intracranially.  No abnormal enhancement.  Findings can be seen with autoimmune, inflammatory, demyelinating, postinfectious or vascular etiologies.  02/24/20 MRI thoracic  - Normal MRI thoracic spine (with and without).   02/18/20 MRI lumbar spine  - Unremarkable MRI lumbar spine with and without contrast. No spinal stenosis or foraminal narrowing.      ASSESSMENT AND PLAN  29 y.o. year old female here with lower extremity weakness, difficulty with urination, numbness in hands and feet. We will proceed with further work-up.  Dx:  1. Relapsing remitting multiple sclerosis (HCC)     Virtual Visit via Video Note  I connected with Paige Davis on 03/10/20 at  3:00 PM EDT by a video enabled telemedicine application and verified that I am speaking with the correct person using two identifiers.  Location: Patient: home  Provider: office   I discussed the limitations of evaluation and management by telemedicine and the availability of in person  appointments. The patient expressed understanding and agreed to proceed.   I discussed the assessment and treatment plan with the patient. The patient was provided an opportunity to ask questions and all were answered. The patient agreed with the plan and demonstrated an understanding of the instructions.   The patient was advised to call back or seek an in-person  evaluation if the symptoms worsen or if the condition fails to improve as anticipated.  I provided 25 minutes of non-face-to-face time during this encounter.    PLAN:  RELASPING REMITTING MULTIPLE SCLEROSIS (LOWER EXTREMITY WEAKNESS / BLADDER SPASM / GAIT DIFFICULTY / HYPERREFLEXIA) - start ocrevus (labs completed) - start ampyra (to improve walking speed) - fatigue (consider modafinil) - refer to urology consult (incomplete bladder emptying) - refer to PT evaluation (gait difficulty)  Meds ordered this encounter  Medications   dalfampridine 10 MG TB12    Sig: Take 1 tablet (10 mg total) by mouth in the morning and at bedtime.    Dispense:  60 tablet    Refill:  12   Orders Placed This Encounter  Procedures   Ambulatory referral to Physical Therapy   Ambulatory referral to Urology   Return in about 4 months (around 07/08/2020).    Suanne Marker, MD 03/10/2020, 3:04 PM Certified in Neurology, Neurophysiology and Neuroimaging  [facility] [address], [address] [place], [place] [address] [phone]

## 2020-03-10 NOTE — Patient Instructions (Signed)
RELASPING REMITTING MULTIPLE SCLEROSIS (LOWER EXTREMITY WEAKNESS / BLADDER SPASM / GAIT DIFFICULTY / HYPERREFLEXIA) - start ocrevus (labs completed) - start ampyra (to improve walking speed) - fatigue (consider modafinil) - refer to urology consult (incomplete bladder emptying) - refer to PT evaluation (gait difficulty)

## 2020-03-11 NOTE — Telephone Encounter (Addendum)
Dalframpidine PA, key: [id number], Your information has been sent to [facility]. [facility] is reviewing your PA request. Typically an electronic response will be received within 72 hours.

## 2020-03-11 NOTE — Telephone Encounter (Addendum)
Called [facility], spoke with [place] who advised I call [employer]. I told her that I had called them, and was told drug is non formulary, PA must be doe through [facility]. She submitted PA and advised I send clinical notes, supporting documents. I faxed over office notes, tele note re: walking test and MRI brain, MRI cervical spine to [phone]. She stated they have 72 hours to make decision. Advised patient via my chart.

## 2020-03-11 NOTE — Telephone Encounter (Addendum)
Messaged patient and asked her to do walking test, send results back so PA can be started. Received reply, walked 25 ft in 16.23 seconds.

## 2020-03-11 NOTE — Telephone Encounter (Addendum)
Received from [facility]: [id number] case has been terminated for dalfampridine Tab 10mg  ER, use as directed (60 per month), for the following reason: The requested drug is not managed by the [facility] Prior Authorization department. Please contact [employer] fully-insured plans at [phone]. Called [employer] [facility], [employer], spoke with [phone] who stated drug is non formulary, need to call [phone] for PA. He transferred call which went to [facility].  I ended the call because of CMM message above.

## 2020-03-15 ENCOUNTER — Encounter: Payer: Self-pay | Admitting: *Deleted

## 2020-03-15 NOTE — Telephone Encounter (Addendum)
Received fax from [facility] stating the requested drug is not managed by [facility] PA dept, contact [employer]. Called # provided,[phone] reached [employer] one, opt 2, 5. Patient's ID [id number],  spoke with Rudell Cobb and advised of prior conversations with [employer] and [facility] reply to submitted PAs. He stated her policy is active 01/20/20 x 1 year, requires reviews. I need to fax drug name, dosage, plan of treatment and supporting documents to their Case management dept [phone], attention: case management. Decision is within 10-14 business days. Printed MRI reports, office notes, and letter of appeal on MD desk for review, signature. Letter signed and all above info faxed to Case Management. Sent my chart to inform patient.

## 2020-03-17 ENCOUNTER — Encounter: Payer: Self-pay | Admitting: *Deleted

## 2020-03-18 ENCOUNTER — Ambulatory Visit: Payer: No Typology Code available for payment source

## 2020-03-22 ENCOUNTER — Other Ambulatory Visit: Payer: Self-pay

## 2020-03-22 ENCOUNTER — Encounter: Payer: Self-pay | Admitting: Rehabilitation

## 2020-03-22 ENCOUNTER — Ambulatory Visit: Payer: No Typology Code available for payment source | Attending: Diagnostic Neuroimaging | Admitting: Rehabilitation

## 2020-03-22 DIAGNOSIS — R29898 Other symptoms and signs involving the musculoskeletal system: Secondary | ICD-10-CM | POA: Insufficient documentation

## 2020-03-22 DIAGNOSIS — R2681 Unsteadiness on feet: Secondary | ICD-10-CM | POA: Diagnosis present

## 2020-03-22 DIAGNOSIS — R2689 Other abnormalities of gait and mobility: Secondary | ICD-10-CM | POA: Diagnosis present

## 2020-03-22 DIAGNOSIS — M6281 Muscle weakness (generalized): Secondary | ICD-10-CM | POA: Insufficient documentation

## 2020-03-22 NOTE — Therapy (Signed)
[facility] [address] [place], [place], [address] Phone: [phone]   Fax:  [phone]  Physical Therapy Evaluation  Patient Details  Name: Paige Davis MRN: [id number] Date of Birth: 09-Nov-1990 Referring Provider (PT): Joycelyn Schmid, MD   Encounter Date: 03/22/2020   PT End of Session - 03/22/20 1212    Visit Number 1    Number of Visits 17    Date for PT Re-Evaluation 06/20/20   POC for 60 days, cert for 90 days   Authorization Type [employer]ette [place]ters Rule    PT Start Time [phone]    PT Stop Time 1017    PT Time Calculation (min) 46 min    Activity Tolerance Patient tolerated treatment well    Behavior During Therapy [facility] for tasks assessed/performed           Past Medical History:  Diagnosis Date  . Anxiety   . Asthma   . Bladder spasms 07/2019  . Scoliosis     Past Surgical History:  Procedure Laterality Date  . BREAST ENHANCEMENT SURGERY      There were no vitals filed for this visit.    Subjective Assessment - 03/22/20 0938    Subjective Pt reports that her R>L leg is extremely weak.  Its just now to a point that I can walk and drive without using cane.  First thing in the morning, I feel very stiff like the "tin man."  I have to really concentrate on lifting this right leg.    Pertinent History relapsing remitting MS (dx Oct), hx of scoliosis, asthma, anxiety    Limitations Standing;Walking;House hold activities    Patient Stated Goals "I want to be able to walk more normally and for longer periods."    Currently in Pain? No/denies              [facility] PT Assessment - 03/22/20 0001      Assessment   Medical Diagnosis relapsing remitting MS    Referring Provider (PT) Joycelyn Schmid, MD    Onset Date/Surgical Date --   Diagnosed in Oct, weakness since March 2021     Precautions   Precautions Fall      Balance Screen   Has the patient fallen in the past 6 months Yes    How many  times? 1    Has the patient had a decrease in activity level because of a fear of falling?  Yes    Is the patient reluctant to leave their home because of a fear of falling?  Yes      Home Environment   Living Environment Private residence    Living Arrangements Spouse/significant other;Children   step kids    Available Help at Discharge Family;Available 24 hours/day    Type of Home House    Home Access Stairs to enter    Entrance Stairs-Number of Steps 3    Entrance Stairs-Rails None   holds wall    Home Layout Multi-level;Able to live on main level with bedroom/bathroom   has basement, doesn't go often    Alternate Level Stairs-Number of Steps 20    Alternate Level Stairs-Rails Right   one side both upstairs and basement    Home Equipment Cane - quad;Shower seat - built in;Grab bars - tub/shower      Prior Function   Level of Independence Independent   uses a cane sometimes    Vocation Full time employment   can work from home, taking  a break at this time    Vocation Requirements needs to be able to do showings/closings     Leisure Traveling, going to concerts, going out       Cognition   Overall Cognitive Status Impaired/Different from baseline   note poor concentration, memory issues-keeps calendar.      Sensation   Light Touch Impaired Detail    Light Touch Impaired Details Impaired RUE;Impaired LUE;Impaired RLE;Impaired LLE   some N/T in hands, R side more dull   Additional Comments B feet hypersensitive       Coordination   Gross Motor Movements are Fluid and Coordinated No   in LEs   Fine Motor Movements are Fluid and Coordinated No    Coordination and Movement Description dec RLE coordination with gait, heel to shin normal     Heel Shin Test grossly [facility]       Tone   Assessment Location Right Lower Extremity;Left Lower Extremity      ROM / Strength   AROM / PROM / Strength Strength      Strength   Overall Strength Deficits    Overall Strength Comments R hip 3+/5,  R knee ext (difficult to maintain) 3+/5, R knee flex 4/5, R ankle DF (not full ROM but strength is 4/5).  L hip flex 4/5, L knee ext 5/5, L knee flex 5/5, L ankle DF 5/5      Transfers   Transfers Sit to Stand;Stand to Sit    Sit to Stand 6: Modified independent (Device/Increase time)    Five time sit to stand comments  15.85 secs without UE support from standard arm chair     Stand to Sit 6: Modified independent (Device/Increase time)      Ambulation/Gait   Ambulation/Gait Yes    Ambulation/Gait Assistance 5: Supervision;4: Min guard    Ambulation/Gait Assistance Details Pt initially very ataxic and high tone indicated when ambulating into clinic, esp in RLE.  Note very narrow BOS and at times drags R foot.  During evaluation, did have her perform knee to chest stretch as well as runner stretch for R calf and second assessment of gait was smoother and less ataxic looking.  Verbally educated her to do these stretches throughout the day.      Ambulation Distance (Feet) 250 Feet    Assistive device None    Gait Pattern Step-through pattern;Decreased stride length;Decreased dorsiflexion - right;Right steppage;Ataxic;Narrow base of support;Poor foot clearance - right    Ambulation Surface Level;Indoor    Gait velocity 2.50 ft/sec without AD     Stairs Yes    Stairs Assistance 5: Supervision    Stairs Assistance Details (indicate cue type and reason) S for safety     Stair Management Technique Two rails;Step to pattern;Forwards    Number of Stairs 4    Height of Stairs 6      Functional Gait  Assessment   Gait assessed  Yes    Gait Level Surface Walks 20 ft, slow speed, abnormal gait pattern, evidence for imbalance or deviates 10-15 in outside of the 12 in walkway width. Requires more than 7 sec to ambulate 20 ft.   8.31 secs    Change in Gait Speed Able to change speed, demonstrates mild gait deviations, deviates 6-10 in outside of the 12 in walkway width, or no gait deviations, unable to  achieve a major change in velocity, or uses a change in velocity, or uses an assistive device.    Gait with  Horizontal Head Turns Performs head turns smoothly with slight change in gait velocity (eg, minor disruption to smooth gait path), deviates 6-10 in outside 12 in walkway width, or uses an assistive device.    Gait with Vertical Head Turns Performs task with slight change in gait velocity (eg, minor disruption to smooth gait path), deviates 6 - 10 in outside 12 in walkway width or uses assistive device    Gait and Pivot Turn Pivot turns safely within 3 sec and stops quickly with no loss of balance.    Step Over Obstacle Is able to step over one shoe box (4.5 in total height) but must slow down and adjust steps to clear box safely. May require verbal cueing.    Gait with Narrow Base of Support Ambulates 4-7 steps.    Gait with Eyes Closed Walks 20 ft, slow speed, abnormal gait pattern, evidence for imbalance, deviates 10-15 in outside 12 in walkway width. Requires more than 9 sec to ambulate 20 ft.    Ambulating Backwards Cannot walk 20 ft without assistance, severe gait deviations or imbalance, deviates greater than 15 in outside 12 in walkway width or will not attempt task.    Steps Two feet to a stair, must use rail.    Total Score 14    FGA comment: < 19 = high risk fall       RLE Tone   RLE Tone Modified Ashworth      RLE Tone   Modified Ashworth Scale for Grading Hypertonia RLE More marked increase in muscle tone through most of the ROM, but affected part(s) easily moved      LLE Tone   LLE Tone Mild   does have a few beats of clonus in LLE                     Objective measurements completed on examination: See above findings.               PT Education - 03/22/20 1211    Education Details Education on evaluation findings, stretching for BLE, goals, POC    Person(s) Educated Patient    Methods Explanation;Demonstration    Comprehension Verbalized  understanding;Returned demonstration            PT Short Term Goals - 03/22/20 1221      PT SHORT TERM GOAL #1   Title Pt will be independent with initial HEP in order to ind dec fall risk, improve functional mobility, and dec tone.  (Target Date: 04/21/20)    Time 4    Period Weeks    Status New    Target Date 04/21/20      PT SHORT TERM GOAL #2   Title Pt will improve 5TSS to </=14 secs without UE support and demonstrating more midline posture throughout.    Time 4    Period Weeks    Status New      PT SHORT TERM GOAL #3   Title Pt will improve gait speed to >/=2.62 ft/sec with decreased gait compensations in RLE and wider BOS in order to indicate safe community ambulation.    Time 4    Period Weeks    Status New      PT SHORT TERM GOAL #4   Title Pt will improve FGA to >/=17/30 in order to indicate dec fall risk.    Time 4    Period Weeks    Status New      PT SHORT  TERM GOAL #5   Title Pt will ambulate up/down 5 steps with single rail, negotiate up/down ramp and curb step at S level in order to indicate safe home and community negotiation.    Time 4    Period Weeks    Status New             PT Long Term Goals - 03/22/20 1229      PT LONG TERM GOAL #1   Title Pt will be independent with final HEP (possible pool HEP) in order to ind improved functional mobility and dec fall risk.  (Target Date:05/22/19)    Time 8    Period Weeks    Status New    Target Date 05/21/20      PT LONG TERM GOAL #2   Title Pt will improve 5TSS to </=12 secs without UE support while demonstrating midline posture.    Time 8    Period Weeks    Status New      PT LONG TERM GOAL #3   Title Pt will improve FGA to >/=20/30 in order to indicate dec fall risk.    Time 8    Period Weeks    Status New      PT LONG TERM GOAL #4   Title Pt will ambulate x 500' over unlevel outdoor surfaces (including grass) w/ LRAD (bracing if needed in future) at mod I level in order to indicate safe  community and leisure activity.    Time 8    Period Weeks    Status New      PT LONG TERM GOAL #5   Title Pt will negotiate up/down flight of stairs in whatever means possible with single rail at mod I level in order to indicate safety at home.    Time 8    Period Weeks    Status New                  Plan - 03/22/20 1213    Clinical Impression Statement Pt is very pleasant 29 y/o female with recent diagnosis of relapsing remitting MS in Oct but has been experiencing weakness and symptoms since March of this year.  Note history of anxiety, asthma, and scoliosis.  Upon PT evaluation, note gait speed of 2.50 ft/sec without device but with marked gait deviations indicative of limited community ambulator, 5TSS time of 15.85 indicative of dec functional strength and increased fall risk and FGA of 14/30 indicative of high fall risk.    Personal Factors and Comorbidities Comorbidity 3+    Comorbidities see above    Examination-Activity Limitations Locomotion Level;Squat;Stairs;Stand;Transfers    Examination-Participation Restrictions Cleaning;Community Activity;Driving;Meal Prep;Yard Work;Occupation    Stability/Clinical Decision Making Evolving/Moderate complexity    Clinical Decision Making Moderate    Rehab Potential Good    PT Frequency 2x / week    PT Duration 8 weeks    PT Treatment/Interventions ADLs/Self Care Home Management;Aquatic Therapy;Electrical Stimulation;DME Instruction;Gait training;Stair training;Functional mobility training;Therapeutic activities;Therapeutic exercise;Balance training;Neuromuscular re-education;Patient/family education;Orthotic Fit/Training;Passive range of motion;Dry needling;Vestibular    PT Next Visit Plan Provide with HEP for RLE strengthening (BLE but R>L), add knee to chest stretch and runners stretch for calf (verbally given at eval).  Did she start taking Baclofen in am and did this help with tone?  work on larger BOS, balance, I'm thinking about  Bioness in future but want to get her stretched out a little first.    Consulted and Agree with Plan of Care Patient  Patient will benefit from skilled therapeutic intervention in order to improve the following deficits and impairments:  Abnormal gait, Decreased balance, Decreased coordination, Decreased endurance, Decreased knowledge of use of DME, Decreased mobility, Decreased range of motion, Decreased strength, Impaired flexibility, Impaired sensation, Impaired tone  Visit Diagnosis: Unsteadiness on feet  Muscle weakness (generalized)  Other abnormalities of gait and mobility  Abnormal muscle tone     Problem List Patient Active Problem List   Diagnosis Date Noted  . Right ovarian cyst 01/27/2011    Harriet Butte, PT, MPT [facility] [address] [place], [place], [address] Phone: [phone]   Fax:  [phone] 03/22/20, 12:39 PM  Name: Tifany Hirsch MRN: [id number] Date of Birth: [birth year]

## 2020-03-26 ENCOUNTER — Other Ambulatory Visit: Payer: Self-pay

## 2020-03-26 ENCOUNTER — Ambulatory Visit: Payer: No Typology Code available for payment source

## 2020-03-26 DIAGNOSIS — M6281 Muscle weakness (generalized): Secondary | ICD-10-CM

## 2020-03-26 DIAGNOSIS — R2689 Other abnormalities of gait and mobility: Secondary | ICD-10-CM

## 2020-03-26 DIAGNOSIS — R2681 Unsteadiness on feet: Secondary | ICD-10-CM | POA: Diagnosis not present

## 2020-03-26 NOTE — Patient Instructions (Addendum)
  Access Code: [email or web address] URL: [email or web address] Date: 03/26/2020 Prepared by: Elmer Bales  Exercises Supine Single Knee to Chest Stretch - 3 x daily - 7 x weekly - 1 sets - 4 reps - 30 sec hold Gastroc Stretch on Wall - 3 x daily - 7 x weekly - 1 sets - 4 reps - 30 sec hold Heel Toe Raises with Counter Support - 2 x daily - 5 x weekly - 2 sets - 10 reps Standing Hip Abduction with Counter Support - 2 x daily - 5 x weekly - 2 sets - 10 reps Mini Squat with Chair - 12 x daily - 5 x weekly - 2 sets - 10 reps

## 2020-03-26 NOTE — Therapy (Addendum)
[facility] [address] [place], [place], [address] Phone: [phone]   Fax:  [phone]  Physical Therapy Treatment  Patient Details  Name: Paige Davis MRN: [id number] Date of Birth: 1990/12/26 Referring Provider (PT): Joycelyn Schmid, MD   Encounter Date: 03/26/2020   PT End of Session - 03/26/20 1219    Visit Number 2    Number of Visits 17    Date for PT Re-Evaluation 06/20/20   POC for 60 days, cert for 90 days   Authorization Type [employer]    PT Start Time 1017    PT Stop Time 1100    PT Time Calculation (min) 43 min    Equipment Utilized During Treatment Gait belt    Activity Tolerance Patient tolerated treatment well    Behavior During Therapy [facility] for tasks assessed/performed           Past Medical History:  Diagnosis Date  . Anxiety   . Asthma   . Bladder spasms 07/2019  . Scoliosis     Past Surgical History:  Procedure Laterality Date  . BREAST ENHANCEMENT SURGERY      There were no vitals filed for this visit.   Subjective Assessment - 03/26/20 1022    Subjective Pt refers to RLE as "dead leg". She reports that from the hip all the way down feels heavy. R foot starts twitching and tingling at night. Pt started Baclofen and it has been helping a lot. Pt also takes one in the morning (in addition to 1 at night).    Pertinent History relapsing remitting MS (dx Oct), hx of scoliosis, asthma, anxiety    Limitations Standing;Walking;House hold activities    Patient Stated Goals "I want to be able to walk more normally and for longer periods."                             [facility] Adult PT Treatment/Exercise - 03/26/20 1031      Ambulation/Gait   Ambulation/Gait Yes    Ambulation/Gait Assistance 4: Min guard;4: Min assist    Ambulation/Gait Assistance Details Pt with ataxic gait with R foot drop and poor foot clearance bilaterally. Pt with 1 severe LOB anteriorly  when concrete changed height. Pt cued on slowing down, focusing on picking up RLE, and widening BOS with gait.    Ambulation Distance (Feet) 850 Feet    Assistive device None    Gait Pattern Step-through pattern;Decreased stride length;Decreased dorsiflexion - right;Right steppage;Ataxic;Narrow base of support;Poor foot clearance - right    Ambulation Surface Outdoor;Unlevel    Stairs Yes    Stairs Assistance 5: Supervision    Stairs Assistance Details (indicate cue type and reason) supervision    Stair Management Technique One rail Left;Alternating pattern    Number of Stairs 8    Height of Stairs 6      Neuro Re-ed    Neuro Re-ed Details  Reciprocal stepping over orange hurdles at counter x8 trials with CGA. PT added lily pads after initial reps for targets for stepping to widen BOS. Side stepping x4 trials in both directions. Pt with more difficulty in R stance. CGA for counter activities.      Exercises   Exercises Other Exercises;Knee/Hip    Other Exercises  Squats x10 with cues to stay back on heels and to slow down. Pt cued on proper squat mechanics. Completed at mat with cues to just tap bottom  on mat and back up into full stand. x10 reps with supervision-CGA with pt having tendency to lean forward on toes and slightly lose balance anteriorly but able to recover on her own by shifitng weight posteriorly.      Knee/Hip Exercises: Aerobic   Tread Mill 4% elevation at 1.7 mph forward for 5 minutes followed by 2 minutes of backward walking at same elevation at 1.2 mph. Pt limited by fatigue and RLE weakness and had to stop after ~2 min.      Knee/Hip Exercises: Standing   Heel Raises 10 reps;1 set    Heel Raises Limitations w/ Toe raise, with countertop support and supervision    Functional Squat 10 reps;1 set    Functional Squat Limitations Pt with forward leaning onto toes but able to correct on her own.      Manual Therapy   Manual Therapy Muscle Energy Technique;Passive ROM     Passive ROM Passive ROM into hip flexion with knee extended and pt cued to push against PT resistance and hold for 5-7 seconds before going into more ROM.Marland Kitchen                  PT Education - 03/26/20 1213    Education Details Pt educated on HEP and energy conservation.    Person(s) Educated Patient    Methods Explanation    Comprehension Verbalized understanding            PT Short Term Goals - 03/22/20 1221      PT SHORT TERM GOAL #1   Title Pt will be independent with initial HEP in order to ind dec fall risk, improve functional mobility, and dec tone.  (Target Date: 04/21/20)    Time 4    Period Weeks    Status New    Target Date 04/21/20      PT SHORT TERM GOAL #2   Title Pt will improve 5TSS to </=14 secs without UE support and demonstrating more midline posture throughout.    Time 4    Period Weeks    Status New      PT SHORT TERM GOAL #3   Title Pt will improve gait speed to >/=2.62 ft/sec with decreased gait compensations in RLE and wider BOS in order to indicate safe community ambulation.    Time 4    Period Weeks    Status New      PT SHORT TERM GOAL #4   Title Pt will improve FGA to >/=17/30 in order to indicate dec fall risk.    Time 4    Period Weeks    Status New      PT SHORT TERM GOAL #5   Title Pt will ambulate up/down 5 steps with single rail, negotiate up/down ramp and curb step at S level in order to indicate safe home and community negotiation.    Time 4    Period Weeks    Status New             PT Long Term Goals - 03/22/20 1229      PT LONG TERM GOAL #1   Title Pt will be independent with final HEP (possible pool HEP) in order to ind improved functional mobility and dec fall risk.  (Target Date:05/22/19)    Time 8    Period Weeks    Status New    Target Date 05/21/20      PT LONG TERM GOAL #2   Title Pt will improve  5TSS to </=12 secs without UE support while demonstrating midline posture.    Time 8    Period Weeks    Status  New      PT LONG TERM GOAL #3   Title Pt will improve FGA to >/=20/30 in order to indicate dec fall risk.    Time 8    Period Weeks    Status New      PT LONG TERM GOAL #4   Title Pt will ambulate x 500' over unlevel outdoor surfaces (including grass) w/ LRAD (bracing if needed in future) at mod I level in order to indicate safe community and leisure activity.    Time 8    Period Weeks    Status New      PT LONG TERM GOAL #5   Title Pt will negotiate up/down flight of stairs in whatever means possible with single rail at mod I level in order to indicate safety at home.    Time 8    Period Weeks    Status New                 Plan - 03/26/20 1220    Clinical Impression Statement Today's skilled PT session focused on gait training with focus on widening BOS and R foot clearance, high-level balance training, and functional LE strength training as well as LE stretching. Pt demonstrates ability to compensate well for deficits but has to really focus on picking up RLE to prevent severe foot drop on R. PT to continue to monitor fatigue throughout session and train balance in optimal gait mechanics and functional strenghtening and balance.    Personal Factors and Comorbidities Comorbidity 3+    Comorbidities see above    Examination-Activity Limitations Locomotion Level;Squat;Stairs;Stand;Transfers    Examination-Participation Restrictions Cleaning;Community Activity;Driving;Meal Prep;Yard Work;Occupation    Stability/Clinical Decision Making Evolving/Moderate complexity    Rehab Potential Good    PT Frequency 2x / week    PT Duration 8 weeks    PT Treatment/Interventions ADLs/Self Care Home Management;Aquatic Therapy;Electrical Stimulation;DME Instruction;Gait training;Stair training;Functional mobility training;Therapeutic activities;Therapeutic exercise;Balance training;Neuromuscular re-education;Patient/family education;Orthotic Fit/Training;Passive range of motion;Dry  needling;Vestibular    PT Next Visit Plan Continue reciprocal stepping with wider BOS and increased foot clearance (>RLE), high level balance and functional LE strengthening. I'm thinking about Bioness in future but want to get her stretched out a little first.    Consulted and Agree with Plan of Care Patient           Patient will benefit from skilled therapeutic intervention in order to improve the following deficits and impairments:  Abnormal gait, Decreased balance, Decreased coordination, Decreased endurance, Decreased knowledge of use of DME, Decreased mobility, Decreased range of motion, Decreased strength, Impaired flexibility, Impaired sensation, Impaired tone  Visit Diagnosis: Unsteadiness on feet  Muscle weakness (generalized)  Other abnormalities of gait and mobility     Problem List Patient Active Problem List   Diagnosis Date Noted  . Right ovarian cyst 01/27/2011    Paige Davis, SPT 03/26/2020, 2:08 PM  [facility] [address] [place], [place], [address] Phone: [phone]   Fax:  [phone]  Name: Paige Davis MRN: [id number] Date of Birth: [birth year]

## 2020-03-29 ENCOUNTER — Encounter: Payer: Self-pay | Admitting: *Deleted

## 2020-03-29 DIAGNOSIS — G35 Multiple sclerosis: Secondary | ICD-10-CM

## 2020-03-29 NOTE — Telephone Encounter (Addendum)
Called [employer] to check status of dalfampridine PA. Spoke with Valentina Gu who stated they received PA appeal 03/15/20. She stated it isn't complete; she'll send message to case management and ask for status of appeal. I should get call back in 2-3 business days. Notified patient via my chart.

## 2020-03-30 ENCOUNTER — Encounter: Payer: Self-pay | Admitting: Physical Therapy

## 2020-03-30 ENCOUNTER — Ambulatory Visit: Payer: No Typology Code available for payment source | Admitting: Physical Therapy

## 2020-03-30 ENCOUNTER — Other Ambulatory Visit: Payer: Self-pay

## 2020-03-30 DIAGNOSIS — R2689 Other abnormalities of gait and mobility: Secondary | ICD-10-CM

## 2020-03-30 DIAGNOSIS — R2681 Unsteadiness on feet: Secondary | ICD-10-CM

## 2020-03-30 DIAGNOSIS — M6281 Muscle weakness (generalized): Secondary | ICD-10-CM

## 2020-03-30 NOTE — Telephone Encounter (Addendum)
Received call from Darl Pikes, with [employer] who stated dalfampridine PA file is still under medical history review. They can't review for coverage of drug until pre existing review is complete to be sure she wasn't seen, treated for this condition before her current insurance policy became effective on 01/20/20. They have requested documentation from all her physicians, time line for PA depends on when they receive documentation they have requested. The medical review will then be completed, and it will go into review for coverage of drug. She documented this call,  verbalized appreciation.

## 2020-03-30 NOTE — Therapy (Signed)
[facility] [address] [place], [place], [address] Phone: [phone]   Fax:  [phone]  Physical Therapy Treatment  Patient Details  Name: Jimmie Dattilio MRN: [id number] Date of Birth: 10-May-1990 Referring Provider (PT): Joycelyn Schmid, MD   Encounter Date: 03/30/2020   PT End of Session - 03/30/20 1238    Visit Number 3    Number of Visits 17    Date for PT Re-Evaluation 06/20/20   POC for 60 days, cert for 90 days   Authorization Type [employer] Rule    PT Start Time 1233    PT Stop Time 1315    PT Time Calculation (min) 42 min    Equipment Utilized During Treatment Gait belt    Activity Tolerance Patient tolerated treatment well    Behavior During Therapy [facility] for tasks assessed/performed           Past Medical History:  Diagnosis Date  . Anxiety   . Asthma   . Bladder spasms 07/2019  . Scoliosis     Past Surgical History:  Procedure Laterality Date  . BREAST ENHANCEMENT SURGERY      There were no vitals filed for this visit.   Subjective Assessment - 03/30/20 1235    Subjective No new complaints. Did have a fall when she was standing on the ottoman trying to fluff out the top of the Christmas tree. Did not get hurt. Made her husband do the rest.    Pertinent History relapsing remitting MS (dx Oct), hx of scoliosis, asthma, anxiety    Limitations Standing;Walking;House hold activities    Patient Stated Goals "I want to be able to walk more normally and for longer periods."    Currently in Pain? No/denies                [facility] Adult PT Treatment/Exercise - 03/30/20 1239      Transfers   Transfers Sit to Stand;Stand to Sit    Sit to Stand 6: Modified independent (Device/Increase time)    Stand to Sit 6: Modified independent (Device/Increase time)      Ambulation/Gait   Ambulation/Gait Yes    Ambulation/Gait Assistance 5: Supervision;4: Min guard    Ambulation/Gait Assistance  Details cues for increased base of support and heel strike with initial contact    Ambulation Distance (Feet) 230 Feet   x1, plus around gym with session   Assistive device None    Gait Pattern Step-through pattern;Decreased stride length;Decreased dorsiflexion - right;Right steppage;Ataxic;Narrow base of support;Poor foot clearance - right    Ambulation Surface Level;Indoor      Neuro Re-ed    Neuro Re-ed Details  for balance/muscle re-ed: gait around track working on scanning all directions randomly with min guard assist.      Knee/Hip Exercises: Stretches   Active Hamstring Stretch Right;3 reps;30 seconds;Limitations    Active Hamstring Stretch Limitations seated at edge of mat with belt used               Balance Exercises - 03/30/20 1256      Balance Exercises: Standing   Rockerboard Anterior/posterior;Lateral;Head turns;EO;EC;30 seconds;Limitations    Rockerboard Limitations performed both ways on balance board: rocking the board with emphasis on tall posture with EO, progressing to EC. then holding the board steady for EC 30 sec's x3 reps, progressing to EC head movements left<>right, up<>down for ~10 reps each. cues on posture/weight shifting to assist with balance. min guard to min assist with occasional touch  to bars for balance.    Balance Beam standing across blue foam beam: alternating fwd heel taps to floor/back onto beam, then alternating bwd toe taps to floor/back onto beam for ~10 reps each/each leg with light to no UE support on bars. cues for increased step length/height to clear beam surface and min guard assist for safety.     Tandem Gait Forward;Retro;Foam/compliant surface;Limitations;4 reps    Tandem Gait Limitations on blue foam beam with cues for step placement with light suport on bars. Min guard assist for safety    Sidestepping Foam/compliant support;4 reps;Limitations    Sidestepping Limitations on blue foam beam with light UE support with cues to lift foot,  not slide it. min guard assist for safety.     Step Over Hurdles / Cones 4 small hurdles in parallel bars: forward reciprocal stepping over them x 6 laps with touch to bars as needed for balance. cues to decreased circumduction on right LE with emphasis on more hip/knee flexion.                PT Short Term Goals - 03/22/20 1221      PT SHORT TERM GOAL #1   Title Pt will be independent with initial HEP in order to ind dec fall risk, improve functional mobility, and dec tone.  (Target Date: 04/21/20)    Time 4    Period Weeks    Status New    Target Date 04/21/20      PT SHORT TERM GOAL #2   Title Pt will improve 5TSS to </=14 secs without UE support and demonstrating more midline posture throughout.    Time 4    Period Weeks    Status New      PT SHORT TERM GOAL #3   Title Pt will improve gait speed to >/=2.62 ft/sec with decreased gait compensations in RLE and wider BOS in order to indicate safe community ambulation.    Time 4    Period Weeks    Status New      PT SHORT TERM GOAL #4   Title Pt will improve FGA to >/=17/30 in order to indicate dec fall risk.    Time 4    Period Weeks    Status New      PT SHORT TERM GOAL #5   Title Pt will ambulate up/down 5 steps with single rail, negotiate up/down ramp and curb step at S level in order to indicate safe home and community negotiation.    Time 4    Period Weeks    Status New             PT Long Term Goals - 03/22/20 1229      PT LONG TERM GOAL #1   Title Pt will be independent with final HEP (possible pool HEP) in order to ind improved functional mobility and dec fall risk.  (Target Date:05/22/19)    Time 8    Period Weeks    Status New    Target Date 05/21/20      PT LONG TERM GOAL #2   Title Pt will improve 5TSS to </=12 secs without UE support while demonstrating midline posture.    Time 8    Period Weeks    Status New      PT LONG TERM GOAL #3   Title Pt will improve FGA to >/=20/30 in order to  indicate dec fall risk.    Time 8    Period Weeks  Status New      PT LONG TERM GOAL #4   Title Pt will ambulate x 500' over unlevel outdoor surfaces (including grass) w/ LRAD (bracing if needed in future) at mod I level in order to indicate safe community and leisure activity.    Time 8    Period Weeks    Status New      PT LONG TERM GOAL #5   Title Pt will negotiate up/down flight of stairs in whatever means possible with single rail at mod I level in order to indicate safety at home.    Time 8    Period Weeks    Status New                 Plan - 03/30/20 1238    Clinical Impression Statement Today's skilled session continued to focus on gait mechanics and balance trianing with no issues noted or reported in session. The pt is making steady progress toward goals and should benefit form continued PT to progress toward unmet goals.    Personal Factors and Comorbidities Comorbidity 3+    Comorbidities see above    Examination-Activity Limitations Locomotion Level;Squat;Stairs;Stand;Transfers    Examination-Participation Restrictions Cleaning;Community Activity;Driving;Meal Prep;Yard Work;Occupation    Stability/Clinical Decision Making Evolving/Moderate complexity    Rehab Potential Good    PT Frequency 2x / week    PT Duration 8 weeks    PT Treatment/Interventions ADLs/Self Care Home Management;Aquatic Therapy;Electrical Stimulation;DME Instruction;Gait training;Stair training;Functional mobility training;Therapeutic activities;Therapeutic exercise;Balance training;Neuromuscular re-education;Patient/family education;Orthotic Fit/Training;Passive range of motion;Dry needling;Vestibular    PT Next Visit Plan Continue reciprocal stepping with wider BOS and increased foot clearance (>RLE), high level balance and functional LE strengthening. I'm thinking about Bioness in future but want to get her stretched out a little first.    Consulted and Agree with Plan of Care Patient            Patient will benefit from skilled therapeutic intervention in order to improve the following deficits and impairments:  Abnormal gait, Decreased balance, Decreased coordination, Decreased endurance, Decreased knowledge of use of DME, Decreased mobility, Decreased range of motion, Decreased strength, Impaired flexibility, Impaired sensation, Impaired tone  Visit Diagnosis: Unsteadiness on feet  Muscle weakness (generalized)  Other abnormalities of gait and mobility     Problem List Patient Active Problem List   Diagnosis Date Noted  . Right ovarian cyst 01/27/2011    Sallyanne Kuster, PTA, [facility] [address] [place], [place] [address] [phone] 03/30/20, 11:20 PM   Name: Myla Mauriello MRN: [id number] Date of Birth: 06/11/1990

## 2020-04-05 NOTE — Telephone Encounter (Addendum)
Called [employer] to check status of dalfampridine review for PA. Spoke with Valentina Gu who stated they are still in medical history review. On 03/26/20 they received patient's authorization to request to receive her past medical records for the history review. She stated they haven't received any of her medical records yet. Timing depends on when they receive past records and complete that review. She stated it can take several weeks.  Called and advised patient of call. She  verbalized understanding, appreciation, will call PCP to remind them to send her records.

## 2020-04-07 ENCOUNTER — Other Ambulatory Visit: Payer: Self-pay

## 2020-04-07 ENCOUNTER — Ambulatory Visit: Payer: No Typology Code available for payment source | Attending: Diagnostic Neuroimaging

## 2020-04-07 DIAGNOSIS — R2681 Unsteadiness on feet: Secondary | ICD-10-CM | POA: Insufficient documentation

## 2020-04-07 DIAGNOSIS — R29898 Other symptoms and signs involving the musculoskeletal system: Secondary | ICD-10-CM | POA: Insufficient documentation

## 2020-04-07 DIAGNOSIS — M6281 Muscle weakness (generalized): Secondary | ICD-10-CM | POA: Insufficient documentation

## 2020-04-07 DIAGNOSIS — R2689 Other abnormalities of gait and mobility: Secondary | ICD-10-CM | POA: Diagnosis present

## 2020-04-07 NOTE — Therapy (Signed)
[facility] [address] [place], [place], [address] Phone: [phone]   Fax:  [phone]  Physical Therapy Treatment  Patient Details  Name: Paige Davis MRN: [id number] Date of Birth: 06/21/1990 Referring Provider (PT): Andrey Spearman, MD   Encounter Date: 04/07/2020   PT End of Session - 04/07/20 1233    Visit Number 4    Number of Visits 17    Date for PT Re-Evaluation 06/20/20   POC for 60 days, cert for 90 days   Authorization Type [employer] [place] [employer] Rule    PT Start Time [phone]    PT Stop Time 1015    PT Time Calculation (min) 43 min    Equipment Utilized During Treatment Gait belt    Activity Tolerance Patient tolerated treatment well    Behavior During Therapy [facility] for tasks assessed/performed           Past Medical History:  Diagnosis Date  . Anxiety   . Asthma   . Bladder spasms 07/2019  . Scoliosis     Past Surgical History:  Procedure Laterality Date  . BREAST ENHANCEMENT SURGERY      There were no vitals filed for this visit.   Subjective Assessment - 04/07/20 0937    Subjective Pt has been doing well. Pt states she has been pushing herself with the exercises and does her stretches every morning when she wakes up. Pt is ging to [place] the 8-14 and PT recommends that she just does her stretches and do walking/swimming with husband. Pt has ordered Posterior R AFO for trip just in case.    Pertinent History relapsing remitting MS (dx Oct), hx of scoliosis, asthma, anxiety    Limitations Standing;Walking;House hold activities    Patient Stated Goals "I want to be able to walk more normally and for longer periods."    Currently in Pain? No/denies                             [facility] Adult PT Treatment/Exercise - 04/07/20 0959      Ambulation/Gait   Ambulation/Gait Yes    Ambulation/Gait Assistance 5: Supervision    Ambulation/Gait Assistance Details walking on treadmill        Neuro Re-ed    Neuro Re-ed Details  Reciprocal stepping on balance bubbles with appropriate spacing for increased step length and widening BOS x6 trials in // bars with cues to slow down and focusing on picking up BLE by bending at the knee. Pt able to complete first 3 trials with minimal UE support on bars intermittently and final 3 trials with no UE support. Rockerboard stepping x5 reps bilaterally for first trial and then rest break and then x6 reps bilaterally on 2nd trial with cues for proper foot placement and linear progression of BLE during swing phase. Pt able to complete both trials with CGA and one UE support on counter. Pt then instructed in forward reciprocal stepping over smalll orange hurdles x 4 trials with cues to bring legs straight through instead of circumducting. Pt with cues to focus on lifting at the hip/knee. PT modified to step-to pattern leading with LLE d/t inabilty to pick up RLE (possibly d/t fatigue). Pt able to complete hurdle navigation with CGA and one UE support on counter.       Exercises   Exercises Other Exercises    Other Exercises  L sidelying knee-to-chest with RLE x 15 reps with  cues on proper alignment.      Knee/Hip Exercises: Aerobic   Tread Mill 1.5 mph with 0% elevation for 5 min, 30 sec with cues for widening BOS and taking bigger step with LLE.      Manual Therapy   Manual Therapy Muscle Energy Technique;Passive ROM    Passive ROM Passive ROM into hip flexion with knee extended and pt cued to push against PT resistance and hold for 5-7 seconds before going into more ROM. x3 reps on each leg with pt gaining ~10 degrees more ROM after each MET.                    PT Short Term Goals - 03/22/20 1221      PT SHORT TERM GOAL #1   Title Pt will be independent with initial HEP in order to ind dec fall risk, improve functional mobility, and dec tone.  (Target Date: 04/21/20)    Time 4    Period Weeks    Status New    Target Date 04/21/20       PT SHORT TERM GOAL #2   Title Pt will improve 5TSS to </=14 secs without UE support and demonstrating more midline posture throughout.    Time 4    Period Weeks    Status New      PT SHORT TERM GOAL #3   Title Pt will improve gait speed to >/=2.62 ft/sec with decreased gait compensations in RLE and wider BOS in order to indicate safe community ambulation.    Time 4    Period Weeks    Status New      PT SHORT TERM GOAL #4   Title Pt will improve FGA to >/=17/30 in order to indicate dec fall risk.    Time 4    Period Weeks    Status New      PT SHORT TERM GOAL #5   Title Pt will ambulate up/down 5 steps with single rail, negotiate up/down ramp and curb step at S level in order to indicate safe home and community negotiation.    Time 4    Period Weeks    Status New             PT Long Term Goals - 03/22/20 1229      PT LONG TERM GOAL #1   Title Pt will be independent with final HEP (possible pool HEP) in order to ind improved functional mobility and dec fall risk.  (Target Date:05/22/19)    Time 8    Period Weeks    Status New    Target Date 05/21/20      PT LONG TERM GOAL #2   Title Pt will improve 5TSS to </=12 secs without UE support while demonstrating midline posture.    Time 8    Period Weeks    Status New      PT LONG TERM GOAL #3   Title Pt will improve FGA to >/=20/30 in order to indicate dec fall risk.    Time 8    Period Weeks    Status New      PT LONG TERM GOAL #4   Title Pt will ambulate x 500' over unlevel outdoor surfaces (including grass) w/ LRAD (bracing if needed in future) at mod I level in order to indicate safe community and leisure activity.    Time 8    Period Weeks    Status New      PT  LONG TERM GOAL #5   Title Pt will negotiate up/down flight of stairs in whatever means possible with single rail at mod I level in order to indicate safety at home.    Time 8    Period Weeks    Status New                 Plan - 04/07/20  1234    Clinical Impression Statement Today's skilled PT session focused on increased L step length and widening BOS with gait as well as improving bil foot clearance. No issues reported during session besides minimal fatigue and frequent rest breaks required. Pt is going to [place] with husband Dec 8-14. PT recommends doing discontinuing HEP on vacation and focusing on stretches only in the AM [place]. The pt is making steady progress toward goals and should benefit form continued PT to progress toward unmet goals. Pt also has ordered Posterior R AFO and PT to trial it on Friday.    Personal Factors and Comorbidities Comorbidity 3+    Comorbidities see above    Examination-Activity Limitations Locomotion Level;Squat;Stairs;Stand;Transfers    Examination-Participation Restrictions Cleaning;Community Activity;Driving;Meal Prep;Yard Work;Occupation    Stability/Clinical Decision Making Evolving/Moderate complexity    Rehab Potential Good    PT Frequency 2x / week    PT Duration 8 weeks    PT Treatment/Interventions ADLs/Self Care Home Management;Aquatic Therapy;Electrical Stimulation;DME Instruction;Gait training;Stair training;Functional mobility training;Therapeutic activities;Therapeutic exercise;Balance training;Neuromuscular re-education;Patient/family education;Orthotic Fit/Training;Passive range of motion;Dry needling;Vestibular    PT Next Visit Plan Trial R Posterior AFO. Continue reciprocal stepping with wider BOS and increased foot clearance (>RLE), high level balance and functional LE strengthening. I'm thinking about Bioness in future but want to get her stretched out a little first.    Consulted and Agree with Plan of Care Patient           Patient will benefit from skilled therapeutic intervention in order to improve the following deficits and impairments:  Abnormal gait, Decreased balance, Decreased coordination, Decreased endurance, Decreased  knowledge of use of DME, Decreased mobility, Decreased range of motion, Decreased strength, Impaired flexibility, Impaired sensation, Impaired tone  Visit Diagnosis: Unsteadiness on feet  Muscle weakness (generalized)  Other abnormalities of gait and mobility     Problem List Patient Active Problem List   Diagnosis Date Noted  . Right ovarian cyst 01/27/2011    Rosalita Levan, SPT 04/07/2020, 12:38 PM  [facility] [address] [place], [place], [address] Phone: [phone]   Fax:  [phone]  Name: Paige Davis MRN: [id number] Date of Birth: 1991/03/07

## 2020-04-09 ENCOUNTER — Encounter: Payer: Self-pay | Admitting: Physical Therapy

## 2020-04-09 ENCOUNTER — Ambulatory Visit: Payer: No Typology Code available for payment source | Admitting: Physical Therapy

## 2020-04-09 ENCOUNTER — Other Ambulatory Visit: Payer: Self-pay

## 2020-04-09 DIAGNOSIS — R2681 Unsteadiness on feet: Secondary | ICD-10-CM

## 2020-04-09 DIAGNOSIS — M6281 Muscle weakness (generalized): Secondary | ICD-10-CM

## 2020-04-09 DIAGNOSIS — R2689 Other abnormalities of gait and mobility: Secondary | ICD-10-CM

## 2020-04-09 MED ORDER — PREDNISONE 10 MG PO TABS: ORAL_TABLET | ORAL | 0 refills | Status: DC

## 2020-04-09 NOTE — Therapy (Addendum)
[facility] [address] [place], [place], [address] Phone: [phone]   Fax:  [phone]  Physical Therapy Treatment  Patient Details  Name: Paige Davis MRN: [id number] Date of Birth: August 17, 1990 Referring Provider (PT): Andrey Spearman, MD   Encounter Date: 04/09/2020   PT End of Session - 04/09/20 1615    Visit Number 5    Number of Visits 17    Date for PT Re-Evaluation 06/20/20   POC for 60 days, cert for 90 days   Authorization Type [employer] [place] [employer] Rule    PT Start Time 1531    PT Stop Time 1611    PT Time Calculation (min) 40 min    Activity Tolerance Patient tolerated treatment well;Patient limited by fatigue    Behavior During Therapy [facility] for tasks assessed/performed           Past Medical History:  Diagnosis Date  . Anxiety   . Asthma   . Bladder spasms 07/2019  . Scoliosis     Past Surgical History:  Procedure Laterality Date  . BREAST ENHANCEMENT SURGERY      There were no vitals filed for this visit.   Subjective Assessment - 04/09/20 1534    Subjective Wearing posterior R AFO today, brace is feeling good. Also ordered one for her LLE just in case for the trip. Has quit smoking and notices her balance has been better. Got stressed out last night and legs are feeling weaker today.    Pertinent History relapsing remitting MS (dx Oct), hx of scoliosis, asthma, anxiety    Limitations Standing;Walking;House hold activities    Patient Stated Goals "I want to be able to walk more normally and for longer periods."    Currently in Pain? No/denies                      04/09/20 0001  Balance Exercises: Standing  Standing Eyes Opened Head turns;Foam/compliant surface;Limitations  Standing Eyes Opened Limitations feet hip width, x5 reps head turns and head nods, x10 reps head turns, 2 x 10 reps head nods (incr difficulty with head nods) needing the wall intermittently for balance, cues  to keep a "soft" bend in the knee to prevent B knee hyperextension  Standing Eyes Closed Wide (BOA);Foam/compliant surface;Limitations  Standing Eyes Closed Limitations 3 x 20 seconds with intermittent touch to the wall for balance, cues to keep a "soft" bend in the knee to prevent B knee hyperextension  Sidestepping Foam/compliant support;Limitations  Sidestepping Limitations down and back 2 reps on level ground, then down and back 2 reps on blue compliant surface, cues for incr R foot clearance, no UE support   Step Over Hurdles / Cones Attempted stepping over 4 hurdles, however pt unable to perform today due to incr fatigue and inability to lift RLE with proper technique with hip/knee flexion vs. Compensatory circumduction  Other Standing Exercises on blue air ex: x10 reps heel <> toe raises, lateral weight shifting with arm reach up towards cabinet x10 reps B            [facility] Adult PT Treatment/Exercise - 04/09/20 1555      Ambulation/Gait   Ambulation/Gait Yes    Ambulation/Gait Assistance 5: Supervision    Ambulation/Gait Assistance Details with new posterior R AFO, pt with improved heel strike with gait during first lap, however during 2nd lap pt demonstrating episodes of decr R foot clearance at time with compensatory circumduction to clear RLE vs. performing  with hip flexion    Ambulation Distance (Feet) 230 Feet    Assistive device None    Gait Pattern Step-through pattern;Decreased stride length;Decreased dorsiflexion - right;Right steppage;Ataxic;Narrow base of support;Poor foot clearance - right    Ambulation Surface Level;Indoor      Self-Care   Self-Care Other Self-Care Comments    Other Self-Care Comments  discussed breathing techniques with pt as pt reports feeling incr stress the past couple days and stress can be a trigger for her sx - educated on technique of breathing in through nose for a count of 4 and out through the nose for a count of 8 to help calm the nervous  system - pt performed x1 minute and reports having MS resources and has tried a couple other breathing techniques that she has read about, also discussed importance of staying hydrated/drinking cold water with regards to MS and pt's recent trip to [place], discussed freezing water bottles in her room, possibly having a cold spray bottle that she can bring around to prevent pt from getting too hot      Exercises   Exercises Other Exercises    Other Exercises  with pt supine on mat table: therapist performing R hamstring stretch with MET 4 x 30 seconds, then pt performing gentle lower trunk rotations x5 reps B with cues for breathing                   PT Education - 04/09/20 1615    Education Details see self-care    Person(s) Educated Patient    Methods Explanation    Comprehension Verbalized understanding;Returned demonstration            PT Short Term Goals - 03/22/20 1221      PT SHORT TERM GOAL #1   Title Pt will be independent with initial HEP in order to ind dec fall risk, improve functional mobility, and dec tone.  (Target Date: 04/21/20)    Time 4    Period Weeks    Status New    Target Date 04/21/20      PT SHORT TERM GOAL #2   Title Pt will improve 5TSS to </=14 secs without UE support and demonstrating more midline posture throughout.    Time 4    Period Weeks    Status New      PT SHORT TERM GOAL #3   Title Pt will improve gait speed to >/=2.62 ft/sec with decreased gait compensations in RLE and wider BOS in order to indicate safe community ambulation.    Time 4    Period Weeks    Status New      PT SHORT TERM GOAL #4   Title Pt will improve FGA to >/=17/30 in order to indicate dec fall risk.    Time 4    Period Weeks    Status New      PT SHORT TERM GOAL #5   Title Pt will ambulate up/down 5 steps with single rail, negotiate up/down ramp and curb step at S level in order to indicate safe home and community negotiation.    Time 4    Period Weeks      Status New             PT Long Term Goals - 03/22/20 1229      PT LONG TERM GOAL #1   Title Pt will be independent with final HEP (possible pool HEP) in order to ind improved functional mobility and  dec fall risk.  (Target Date:05/22/19)    Time 8    Period Weeks    Status New    Target Date 05/21/20      PT LONG TERM GOAL #2   Title Pt will improve 5TSS to </=12 secs without UE support while demonstrating midline posture.    Time 8    Period Weeks    Status New      PT LONG TERM GOAL #3   Title Pt will improve FGA to >/=20/30 in order to indicate dec fall risk.    Time 8    Period Weeks    Status New      PT LONG TERM GOAL #4   Title Pt will ambulate x 500' over unlevel outdoor surfaces (including grass) w/ LRAD (bracing if needed in future) at mod I level in order to indicate safe community and leisure activity.    Time 8    Period Weeks    Status New      PT LONG TERM GOAL #5   Title Pt will negotiate up/down flight of stairs in whatever means possible with single rail at mod I level in order to indicate safety at home.    Time 8    Period Weeks    Status New                04/10/20 0820  Plan  Clinical Impression Statement Pt brought in her new R posterior AFO to clinic today. Pt demonstrated improved R heel strike during first lap, during 2nd lap pt with decr RLE foot clearance at times and compensating with R circumduction. Pt reporting feeling more fatigued today due to recent stressors. Frequent seated rest breaks taken throughout session. Focused on standing balance strategies on compliant surfaces with pt demonstrating incr L>RLE genu recurvatum today, needing frequent tactile and verbal cues to keep a soft bend in the knee. Will continue to progress towards LTGs.  Personal Factors and Comorbidities Comorbidity 3+  Comorbidities see above  Examination-Activity Limitations Locomotion Level;Squat;Stairs;Stand;Transfers  Examination-Participation  Restrictions Cleaning;Community Activity;Driving;Meal Prep;Yard Work;Occupation  Pt will benefit from skilled therapeutic intervention in order to improve on the following deficits Abnormal gait;Decreased balance;Decreased coordination;Decreased endurance;Decreased knowledge of use of DME;Decreased mobility;Decreased range of motion;Decreased strength;Impaired flexibility;Impaired sensation;Impaired tone  Stability/Clinical Decision Making Evolving/Moderate complexity  Rehab Potential Good  PT Frequency 2x / week  PT Duration 8 weeks  PT Treatment/Interventions ADLs/Self Care Home Management;Aquatic Therapy;Electrical Stimulation;DME Instruction;Gait training;Stair training;Functional mobility training;Therapeutic activities;Therapeutic exercise;Balance training;Neuromuscular re-education;Patient/family education;Orthotic Fit/Training;Passive range of motion;Dry needling;Vestibular  PT Next Visit Plan Trial R Posterior AFO. Continue reciprocal stepping with wider BOS and increased foot clearance (>RLE), high level balance and functional LE strengthening. I'm thinking about Bioness in future but want to get her stretched out a little first.  Consulted and Agree with Plan of Care Patient      Patient will benefit from skilled therapeutic intervention in order to improve the following deficits and impairments:  Abnormal gait, Decreased balance, Decreased coordination, Decreased endurance, Decreased knowledge of use of DME, Decreased mobility, Decreased range of motion, Decreased strength, Impaired flexibility, Impaired sensation, Impaired tone  Visit Diagnosis: Unsteadiness on feet  Muscle weakness (generalized)  Other abnormalities of gait and mobility     Problem List Patient Active Problem List   Diagnosis Date Noted  . Right ovarian cyst 01/27/2011    Arliss Journey, PT, DPT  04/10/2020, 8:46 AM  [facility] [address]  [place], [place], [address] Phone: [phone]   Fax:  [phone]  Name: Paige Davis MRN: [id number] Date of Birth: 1991/05/03

## 2020-04-09 NOTE — Telephone Encounter (Signed)
Ok. I sent in rx.   Meds ordered this encounter  Medications  . predniSONE (DELTASONE) 10 MG tablet    Sig: Take 60mg  on day 1. Reduce by 10mg  each subsequent day. (60, 50, 40, 30, 20, 10, stop)    Dispense:  21 tablet    Refill:  0    , MD 04/09/2020, 3:22 PM Certified in Neurology, Neurophysiology and Neuroimaging  [facility] [address] [place], [address] [place] [phone]

## 2020-04-13 ENCOUNTER — Ambulatory Visit: Payer: No Typology Code available for payment source | Admitting: Physical Therapy

## 2020-04-21 ENCOUNTER — Encounter: Payer: Self-pay | Admitting: Physical Therapy

## 2020-04-21 ENCOUNTER — Other Ambulatory Visit: Payer: Self-pay

## 2020-04-21 ENCOUNTER — Ambulatory Visit: Payer: No Typology Code available for payment source | Admitting: Physical Therapy

## 2020-04-21 DIAGNOSIS — R2689 Other abnormalities of gait and mobility: Secondary | ICD-10-CM

## 2020-04-21 DIAGNOSIS — M6281 Muscle weakness (generalized): Secondary | ICD-10-CM

## 2020-04-21 DIAGNOSIS — R2681 Unsteadiness on feet: Secondary | ICD-10-CM | POA: Diagnosis not present

## 2020-04-21 NOTE — Therapy (Signed)
[facility] [address] [place], [place], [address] Phone: [phone]   Fax:  [phone]  Physical Therapy Treatment  Patient Details  Name: Paige Davis MRN: [id number] Date of Birth: 1990/12/28 Referring Provider (PT): Andrey Spearman, MD   Encounter Date: 04/21/2020   PT End of Session - 04/21/20 1023    Visit Number 6    Number of Visits 17    Date for PT Re-Evaluation 06/20/20   POC for 60 days, cert for 90 days   Authorization Type [employer]    PT Start Time 1019    PT Stop Time 1100    PT Time Calculation (min) 41 min    Equipment Utilized During Treatment Gait belt    Activity Tolerance Patient tolerated treatment well    Behavior During Therapy [facility] for tasks assessed/performed           Past Medical History:  Diagnosis Date  . Anxiety   . Asthma   . Bladder spasms 07/2019  . Scoliosis     Past Surgical History:  Procedure Laterality Date  . BREAST ENHANCEMENT SURGERY      There were no vitals filed for this visit.   Subjective Assessment - 04/21/20 1022    Subjective No new complaints. No falls or pain. Had a great vacation, feeling much better.    Pertinent History relapsing remitting MS (dx Oct), hx of scoliosis, asthma, anxiety    Limitations Standing;Walking;House hold activities    Patient Stated Goals "I want to be able to walk more normally and for longer periods."    Currently in Pain? No/denies              [facility] PT Assessment - 04/21/20 1025      Functional Gait  Assessment   Gait assessed  Yes    Gait Level Surface Walks 20 ft in less than 5.5 sec, no assistive devices, good speed, no evidence for imbalance, normal gait pattern, deviates no more than 6 in outside of the 12 in walkway width.    Change in Gait Speed Able to smoothly change walking speed without loss of balance or gait deviation. Deviate no more than 6 in outside of the 12 in walkway width.     Gait with Horizontal Head Turns Performs head turns smoothly with no change in gait. Deviates no more than 6 in outside 12 in walkway width    Gait with Vertical Head Turns Performs head turns with no change in gait. Deviates no more than 6 in outside 12 in walkway width.    Gait and Pivot Turn Pivot turns safely within 3 sec and stops quickly with no loss of balance.    Step Over Obstacle Is able to step over one shoe box (4.5 in total height) without changing gait speed. No evidence of imbalance.    Gait with Narrow Base of Support Ambulates less than 4 steps heel to toe or cannot perform without assistance.    Gait with Eyes Closed Walks 20 ft, slow speed, abnormal gait pattern, evidence for imbalance, deviates 10-15 in outside 12 in walkway width. Requires more than 9 sec to ambulate 20 ft.   >15 sec's   Ambulating Backwards Walks 20 ft, slow speed, abnormal gait pattern, evidence for imbalance, deviates 10-15 in outside 12 in walkway width.    Steps Alternating feet, must use rail.    Total Score 21    FGA comment: 19-24 medium risk for falls  [facility] Adult PT Treatment/Exercise - 04/21/20 1025      Transfers   Transfers Sit to Stand;Stand to Sit    Sit to Stand 6: Modified independent (Device/Increase time)    Five time sit to stand comments  11.57 sec's no UE support from standard height chair    Stand to Sit 6: Modified independent (Device/Increase time)      Ambulation/Gait   Ambulation/Gait Yes    Ambulation/Gait Assistance 5: Supervision    Assistive device None    Gait Pattern Step-through pattern;Decreased stride length;Decreased dorsiflexion - right;Right steppage;Ataxic;Narrow base of support;Poor foot clearance - right    Ambulation Surface Level;Indoor    Gait velocity 8.34 sec's= 3.93 without AD    Stairs Yes    Stairs Assistance 5: Supervision    Stair Management Technique One rail Left;Alternating pattern;Forwards    Number of Stairs 4   x2 reps    Height of Stairs 6    Curb 5: Supervision    Curb Details (indicate cue type and reason) indoor 6 inch curb with cues to maintain momentum with ascending and descending to assist with balance.               Balance Exercises - 04/21/20 1040      Balance Exercises: Standing   Standing Eyes Closed Narrow base of support (BOS);Wide (BOA);Head turns;Foam/compliant surface;Other reps (comment);30 secs;Limitations    Standing Eyes Closed Limitations on airex with no UE support:    Tandem Stance Eyes open;Foam/compliant surface;Intermittent upper extremity support;3 reps;30 secs;Limitations    Tandem Stance Time on blue foam beam:    Sidestepping Foam/compliant support;3 reps;Limitations    Sidestepping Limitations on blue foam beam with no UE support, occasional touch to bars for 3 laps each way. Min guard assist.               PT Short Term Goals - 04/21/20 1023      PT SHORT TERM GOAL #1   Title Pt will be independent with initial HEP in order to ind dec fall risk, improve functional mobility, and dec tone.  (Target Date: 04/21/20)    Baseline 04/21/20: met with current program, will benefit from advancement of program as she progresses    Status Achieved      PT SHORT TERM GOAL #2   Title Pt will improve 5TSS to </=14 secs without UE support and demonstrating more midline posture throughout.    Baseline 04/21/20: 11.57 sec's no UE support from standard height chair    Time --    Period --    Status Achieved      PT SHORT TERM GOAL #3   Title Pt will improve gait speed to >/=2.62 ft/sec with decreased gait compensations in RLE and wider BOS in order to indicate safe community ambulation.    Baseline 04/21/20: 3.93 ft/sec without AD    Time --    Period --    Status Achieved      PT SHORT TERM GOAL #4   Title Pt will improve FGA to >/=17/30 in order to indicate dec fall risk.    Baseline 04/21/20: 21/30 scored today    Time --    Period --    Status Achieved      PT  SHORT TERM GOAL #5   Title Pt will ambulate up/down 5 steps with single rail, negotiate up/down ramp and curb step at S level in order to indicate safe home and community negotiation.    Baseline 04/21/20:  met in session today    Time --    Period --    Status Achieved             PT Long Term Goals - 03/22/20 1229      PT LONG TERM GOAL #1   Title Pt will be independent with final HEP (possible pool HEP) in order to ind improved functional mobility and dec fall risk.  (Target Date:05/22/19)    Time 8    Period Weeks    Status New    Target Date 05/21/20      PT LONG TERM GOAL #2   Title Pt will improve 5TSS to </=12 secs without UE support while demonstrating midline posture.    Time 8    Period Weeks    Status New      PT LONG TERM GOAL #3   Title Pt will improve FGA to >/=20/30 in order to indicate dec fall risk.    Time 8    Period Weeks    Status New      PT LONG TERM GOAL #4   Title Pt will ambulate x 500' over unlevel outdoor surfaces (including grass) w/ LRAD (bracing if needed in future) at mod I level in order to indicate safe community and leisure activity.    Time 8    Period Weeks    Status New      PT LONG TERM GOAL #5   Title Pt will negotiate up/down flight of stairs in whatever means possible with single rail at mod I level in order to indicate safety at home.    Time 8    Period Weeks    Status New                 Plan - 04/21/20 1023    Clinical Impression Statement Today's skilled session intially focused on progress toward STGs with all goals met. The pt decreased her 5 time sit to stand 11.57 sec's no UE support, increased her 10 meter gait speed to 3.93 ft/sec no AD and improved her Functional Gait Index score to 21/30. Remainder of session focused on balance training on complaint surfaces with up to min assist needed. The pt is progressing toward goals and should benefit from continued PT to progress toward unmet goals.    Personal  Factors and Comorbidities Comorbidity 3+    Comorbidities see above    Examination-Activity Limitations Locomotion Level;Squat;Stairs;Stand;Transfers    Examination-Participation Restrictions Cleaning;Community Activity;Driving;Meal Prep;Yard Work;Occupation    Stability/Clinical Decision Making Evolving/Moderate complexity    Rehab Potential Good    PT Frequency 2x / week    PT Duration 8 weeks    PT Treatment/Interventions ADLs/Self Care Home Management;Aquatic Therapy;Electrical Stimulation;DME Instruction;Gait training;Stair training;Functional mobility training;Therapeutic activities;Therapeutic exercise;Balance training;Neuromuscular re-education;Patient/family education;Orthotic Fit/Training;Passive range of motion;Dry needling;Vestibular    PT Next Visit Plan Continue to work on balance on complaint surfaces, LE strengthening    Consulted and Agree with Plan of Care Patient           Patient will benefit from skilled therapeutic intervention in order to improve the following deficits and impairments:  Abnormal gait,Decreased balance,Decreased coordination,Decreased endurance,Decreased knowledge of use of DME,Decreased mobility,Decreased range of motion,Decreased strength,Impaired flexibility,Impaired sensation,Impaired tone  Visit Diagnosis: Unsteadiness on feet  Muscle weakness (generalized)  Other abnormalities of gait and mobility     Problem List Patient Active Problem List   Diagnosis Date Noted  . Right ovarian cyst 01/27/2011    Willow Ora,  PTA, [facility] [address], [place], [place] [address] [phone] 04/21/20, 5:20 PM   Name: Paige Davis MRN: [id number] Date of Birth: 01-Dec-1990

## 2020-04-23 ENCOUNTER — Encounter: Payer: Self-pay | Admitting: Physical Therapy

## 2020-04-23 ENCOUNTER — Other Ambulatory Visit: Payer: Self-pay

## 2020-04-23 ENCOUNTER — Ambulatory Visit: Payer: No Typology Code available for payment source | Admitting: Physical Therapy

## 2020-04-23 DIAGNOSIS — R2689 Other abnormalities of gait and mobility: Secondary | ICD-10-CM

## 2020-04-23 DIAGNOSIS — R2681 Unsteadiness on feet: Secondary | ICD-10-CM

## 2020-04-23 DIAGNOSIS — M6281 Muscle weakness (generalized): Secondary | ICD-10-CM

## 2020-04-27 ENCOUNTER — Ambulatory Visit: Payer: No Typology Code available for payment source | Admitting: Physical Therapy

## 2020-04-27 ENCOUNTER — Telehealth: Payer: Self-pay | Admitting: Diagnostic Neuroimaging

## 2020-04-27 NOTE — Telephone Encounter (Signed)
Pt called wanting to discuss her Ocrevus with the Provider or RN. Pt would not give any other information. Please advise.

## 2020-04-27 NOTE — Therapy (Signed)
[facility] [address] [place], [place], [address] Phone: [phone]   Fax:  [phone]  Physical Therapy Treatment  Patient Details  Name: Paige Davis Full MRN: [id number] Date of Birth: Jan 13, 1991 Referring Provider (PT): Andrey Spearman, MD   Encounter Date: 04/23/2020    04/23/20 1021  PT Visits / Re-Eval  Visit Number 7  Number of Visits 17  Date for PT Re-Evaluation 06/20/20 (POC for 60 days, cert for 90 days)  Authorization  Authorization Type [employer] [place] [employer]  PT Time Calculation  PT Start Time 1017  PT Stop Time 1100  PT Time Calculation (min) 43 min  PT - End of Session  Equipment Utilized During Treatment Gait belt  Activity Tolerance Patient tolerated treatment well  Behavior During Therapy [facility] for tasks assessed/performed     Past Medical History:  Diagnosis Date  . Anxiety   . Asthma   . Bladder spasms 07/2019  . Scoliosis     Past Surgical History:  Procedure Laterality Date  . BREAST ENHANCEMENT SURGERY      There were no vitals filed for this visit.     04/23/20 1020  Symptoms/Limitations  Subjective No falls or pain. Was able to wear low heels and take a few steps the other day.  Pertinent History relapsing remitting MS (dx Oct), hx of scoliosis, asthma, anxiety  Limitations Standing;Walking;House hold activities  Patient Stated Goals "I want to be able to walk more normally and for longer periods."  Pain Assessment  Currently in Pain? No/denies      04/23/20 1021  Transfers  Transfers Sit to Stand;Stand to Sit  Sit to Stand 6: Modified independent (Device/Increase time)  Stand to Sit 6: Modified independent (Device/Increase time)  Ambulation/Gait  Ambulation/Gait Yes  Ambulation/Gait Assistance 5: Supervision  Ambulation/Gait Assistance Details around gym with session  Assistive device None  Gait Pattern Step-through pattern;Decreased stride length;Decreased  dorsiflexion - right;Right steppage;Ataxic;Narrow base of support;Poor foot clearance - right  Ambulation Surface Level;Indoor  High Level Balance  High Level Balance Activities Marching forwards;Marching backwards;Tandem walking (tandem/heel/toe walking fwd/bwd)  High Level Balance Comments on red/blue mats next to counter top for 3 laps each with light to no UE support on counter. cues for posture and ex form. rest breaks needed due to right LE fatigue.      04/23/20 1033  Balance Exercises: Standing  Standing Eyes Closed Narrow base of support (BOS);Head turns;Foam/compliant surface;Other reps (comment);30 secs;Limitations  Standing Eyes Closed Limitations on airex with feet together: EC 30 sec's x 3 reps with up to min assist needed for balance with cues on posture/weight shifting to assist with balance recovery.  Rockerboard Anterior/posterior;Lateral;EO;EC;30 seconds;10 reps;Limitations  Rockerboard Limitations performed on balance board both ways: rocking the board with EO, progressing to [facility] with emphasis on tall posture/weight shifting for balance. then holding the board steady with EC 30 sec's x 3 reps. up to min assist needed for balance with no UE support, occasional touch to the bars.  Sit to Stand Standard surface;Without upper extremity support;Foam/compliant surface;Limitations  Sit to Stand Limitations seated at edge of mat table with feet across red foam beam:         PT Short Term Goals - 04/21/20 1023      PT SHORT TERM GOAL #1   Title Pt will be independent with initial HEP in order to ind dec fall risk, improve functional mobility, and dec tone.  (Target Date: 04/21/20)    Baseline 04/21/20: met  with current program, will benefit from advancement of program as she progresses    Status Achieved      PT SHORT TERM GOAL #2   Title Pt will improve 5TSS to </=14 secs without UE support and demonstrating more midline posture throughout.    Baseline 04/21/20: 11.57 sec's  no UE support from standard height chair    Time --    Period --    Status Achieved      PT SHORT TERM GOAL #3   Title Pt will improve gait speed to >/=2.62 ft/sec with decreased gait compensations in RLE and wider BOS in order to indicate safe community ambulation.    Baseline 04/21/20: 3.93 ft/sec without AD    Time --    Period --    Status Achieved      PT SHORT TERM GOAL #4   Title Pt will improve FGA to >/=17/30 in order to indicate dec fall risk.    Baseline 04/21/20: 21/30 scored today    Time --    Period --    Status Achieved      PT SHORT TERM GOAL #5   Title Pt will ambulate up/down 5 steps with single rail, negotiate up/down ramp and curb step at S level in order to indicate safe home and community negotiation.    Baseline 04/21/20: met in session today    Time --    Period --    Status Achieved             PT Long Term Goals - 03/22/20 1229      PT LONG TERM GOAL #1   Title Pt will be independent with final HEP (possible pool HEP) in order to ind improved functional mobility and dec fall risk.  (Target Date:05/22/19)    Time 8    Period Weeks    Status New    Target Date 05/21/20      PT LONG TERM GOAL #2   Title Pt will improve 5TSS to </=12 secs without UE support while demonstrating midline posture.    Time 8    Period Weeks    Status New      PT LONG TERM GOAL #3   Title Pt will improve FGA to >/=20/30 in order to indicate dec fall risk.    Time 8    Period Weeks    Status New      PT LONG TERM GOAL #4   Title Pt will ambulate x 500' over unlevel outdoor surfaces (including grass) w/ LRAD (bracing if needed in future) at mod I level in order to indicate safe community and leisure activity.    Time 8    Period Weeks    Status New      PT LONG TERM GOAL #5   Title Pt will negotiate up/down flight of stairs in whatever means possible with single rail at mod I level in order to indicate safety at home.    Time 8    Period Weeks    Status New              04/23/20 1021  Plan  Clinical Impression Statement Today's skilled session focused on balance training with rest breaks taken as needed due to lower extremity fatigue. No other issues noted or reported in session. The pt is progressing toward goals and should benefit from continued PT to progress toward unmet goals.  Personal Factors and Comorbidities Comorbidity 3+  Comorbidities see above  Examination-Activity Limitations  Locomotion Level;Squat;Stairs;Stand;Transfers  Examination-Participation Restrictions Cleaning;Community Activity;Driving;Meal Prep;Yard Work;Occupation  Pt will benefit from skilled therapeutic intervention in order to improve on the following deficits Abnormal gait;Decreased balance;Decreased coordination;Decreased endurance;Decreased knowledge of use of DME;Decreased mobility;Decreased range of motion;Decreased strength;Impaired flexibility;Impaired sensation;Impaired tone  Stability/Clinical Decision Making Evolving/Moderate complexity  Rehab Potential Good  PT Frequency 2x / week  PT Duration 8 weeks  PT Treatment/Interventions ADLs/Self Care Home Management;Aquatic Therapy;Electrical Stimulation;DME Instruction;Gait training;Stair training;Functional mobility training;Therapeutic activities;Therapeutic exercise;Balance training;Neuromuscular re-education;Patient/family education;Orthotic Fit/Training;Passive range of motion;Dry needling;Vestibular  PT Next Visit Plan Continue to work on balance on complaint surfaces, LE strengthening  Consulted and Agree with Plan of Care Patient          Patient will benefit from skilled therapeutic intervention in order to improve the following deficits and impairments:  Abnormal gait,Decreased balance,Decreased coordination,Decreased endurance,Decreased knowledge of use of DME,Decreased mobility,Decreased range of motion,Decreased strength,Impaired flexibility,Impaired sensation,Impaired tone  Visit  Diagnosis: Unsteadiness on feet  Muscle weakness (generalized)  Other abnormalities of gait and mobility     Problem List Patient Active Problem List   Diagnosis Date Noted  . Right ovarian cyst 01/27/2011    Willow Ora, PTA, [facility] [address] [place], [place] [address] [phone] 04/27/20, 5:07 PM    Name: Levia Waltermire MRN: [id number] Date of Birth: 1990/09/16

## 2020-04-27 NOTE — Telephone Encounter (Signed)
I called pt. Her insurance has finally approved the ocrevus infusion. I will ask [facility] to process this information and schedule her ASAP for the ocrevus infusion. Pt verbalized understanding.

## 2020-04-29 ENCOUNTER — Encounter: Payer: Self-pay | Admitting: Physical Therapy

## 2020-04-29 ENCOUNTER — Other Ambulatory Visit: Payer: Self-pay

## 2020-04-29 ENCOUNTER — Ambulatory Visit: Payer: No Typology Code available for payment source | Admitting: Physical Therapy

## 2020-04-29 DIAGNOSIS — R2681 Unsteadiness on feet: Secondary | ICD-10-CM | POA: Diagnosis not present

## 2020-04-29 DIAGNOSIS — R2689 Other abnormalities of gait and mobility: Secondary | ICD-10-CM

## 2020-04-29 DIAGNOSIS — M6281 Muscle weakness (generalized): Secondary | ICD-10-CM

## 2020-04-29 DIAGNOSIS — R29898 Other symptoms and signs involving the musculoskeletal system: Secondary | ICD-10-CM

## 2020-04-29 NOTE — Therapy (Signed)
[facility] [address] [place], [place], [address] Phone: [phone]   Fax:  [phone]  Physical Therapy Treatment  Patient Details  Name: Paige Davis MRN: [id number] Date of Birth: [birth year] Referring Provider (PT): Andrey Spearman, MD   Encounter Date: 04/29/2020   PT End of Session - 04/29/20 8242    Visit Number 8    Number of Visits 17    Date for PT Re-Evaluation 06/20/20   POC for 60 days, cert for 90 days   Authorization Type [employer] [place] [employer] Rule    PT Start Time [phone]    PT Stop Time 1015    PT Time Calculation (min) 40 min    Equipment Utilized During Treatment Gait belt    Activity Tolerance Patient tolerated treatment well    Behavior During Therapy [facility] for tasks assessed/performed           Past Medical History:  Diagnosis Date  . Anxiety   . Asthma   . Bladder spasms 07/2019  . Scoliosis     Past Surgical History:  Procedure Laterality Date  . BREAST ENHANCEMENT SURGERY      There were no vitals filed for this visit.   Subjective Assessment - 04/29/20 0936    Subjective Has one fall since last visit when she was going up the outside step while carrying her dog and talking on the phone in slippers. Was able to get herself up and denies any injuries.    Pertinent History relapsing remitting MS (dx Oct), hx of scoliosis, asthma, anxiety    Limitations Standing;Walking;House hold activities    Patient Stated Goals "I want to be able to walk more normally and for longer periods."    Currently in Pain? No/denies                 [facility] Adult PT Treatment/Exercise - 04/29/20 0938      Transfers   Transfers Sit to Stand;Stand to Sit    Sit to Stand 6: Modified independent (Device/Increase time)    Stand to Sit 6: Modified independent (Device/Increase time)      Ambulation/Gait   Ambulation/Gait Yes    Ambulation/Gait Assistance 5: Supervision    Assistive device None    Gait  Pattern Step-through pattern;Decreased stride length;Decreased dorsiflexion - right;Right steppage;Ataxic;Narrow base of support;Poor foot clearance - right    Ambulation Surface Level;Indoor      High Level Balance   High Level Balance Comments on red/blue mats next to counter: hurdles of varied heights with reciprocal stepping with cues for hip/knee flexion and decreased circumdution for 4 laps foward with light support on counter; then laterally stepping over small hurdles only for 4 laps total (2 toward each direction) with light support on coutner. cues for hip/knee flexion, not to kick leg back and over. min guard to min assist. one episode of right ankle rolling with pt able to self correct when cued.      Exercises   Exercises Other Exercises    Other Exercises  at counter with red band around legs above knees: in mini squat position side stepping for 3 laps each way, then diagonal stepping fwd/bwd for 3 laps each way, then with feet apart in squat position fwd/bwd stepping for 3 laps each way. UE support on counter with cues on technique and ex form.      Knee/Hip Exercises: Aerobic   Other Aerobic Scifit level 3.0 legs only for 6 minutes with goal >/=50 rpm  fro strengthening and activity tolerance               Balance Exercises - 04/29/20 0958      Balance Exercises: Standing   Standing Eyes Closed Narrow base of support (BOS);Wide (BOA);Head turns;Foam/compliant surface;Other reps (comment);30 secs;Limitations    Standing Eyes Closed Limitations on airex: feet together for EC 30 sec's x 3 reps, progressing to feet apart for EC head movements left<>right, up<>down for ~10 rep each. min guard to min assist with occasional touch to wall/chair for balance.    Balance Beam standing across blue foam beam: alternating fwd heel taps to floor/back onto beam, then alternating bwd toe taps to floor/back onto beam for ~10 reps each/each leg with light to no UE support on counter. cues for  increased step length/height to clear beam surface and min guard assist for safety.    Tandem Gait Forward;Retro;Foam/compliant surface;Limitations;4 reps    Tandem Gait Limitations on blue foam beam with cues for step placement with light suport on counter for 4 laps each way. Min guard assist for safety    Sidestepping Foam/compliant support;3 reps;Limitations    Sidestepping Limitations on blue foam beam with no UE support, occasional touch to counter for 4  laps each way. Min guard assist.               PT Short Term Goals - 04/21/20 1023      PT SHORT TERM GOAL #1   Title Pt will be independent with initial HEP in order to ind dec fall risk, improve functional mobility, and dec tone.  (Target Date: 04/21/20)    Baseline 04/21/20: met with current program, will benefit from advancement of program as she progresses    Status Achieved      PT SHORT TERM GOAL #2   Title Pt will improve 5TSS to </=14 secs without UE support and demonstrating more midline posture throughout.    Baseline 04/21/20: 11.57 sec's no UE support from standard height chair    Time --    Period --    Status Achieved      PT SHORT TERM GOAL #3   Title Pt will improve gait speed to >/=2.62 ft/sec with decreased gait compensations in RLE and wider BOS in order to indicate safe community ambulation.    Baseline 04/21/20: 3.93 ft/sec without AD    Time --    Period --    Status Achieved      PT SHORT TERM GOAL #4   Title Pt will improve FGA to >/=17/30 in order to indicate dec fall risk.    Baseline 04/21/20: 21/30 scored today    Time --    Period --    Status Achieved      PT SHORT TERM GOAL #5   Title Pt will ambulate up/down 5 steps with single rail, negotiate up/down ramp and curb step at S level in order to indicate safe home and community negotiation.    Baseline 04/21/20: met in session today    Time --    Period --    Status Achieved             PT Long Term Goals - 03/22/20 1229       PT LONG TERM GOAL #1   Title Pt will be independent with final HEP (possible pool HEP) in order to ind improved functional mobility and dec fall risk.  (Target Date:05/22/19)    Time 8    Period Weeks  Status New    Target Date 05/21/20      PT LONG TERM GOAL #2   Title Pt will improve 5TSS to </=12 secs without UE support while demonstrating midline posture.    Time 8    Period Weeks    Status New      PT LONG TERM GOAL #3   Title Pt will improve FGA to >/=20/30 in order to indicate dec fall risk.    Time 8    Period Weeks    Status New      PT LONG TERM GOAL #4   Title Pt will ambulate x 500' over unlevel outdoor surfaces (including grass) w/ LRAD (bracing if needed in future) at mod I level in order to indicate safe community and leisure activity.    Time 8    Period Weeks    Status New      PT LONG TERM GOAL #5   Title Pt will negotiate up/down flight of stairs in whatever means possible with single rail at mod I level in order to indicate safety at home.    Time 8    Period Weeks    Status New                 Plan - 04/29/20 0388    Clinical Impression Statement Today's skilled session continued to focus on LE strengthening and balance training with rest breaks taken as needed due to LE fatigue. Pt noted to continue to demo decreased bil ankle stability on complaint surfaces at times, can correct with cued and continues to demo bil recurvatum, increased with fatigue. The pt is progressing with increased activity tolerance with higher level activities. The pt should benefit from continued PT to progreess toward unmet goals.    Personal Factors and Comorbidities Comorbidity 3+    Comorbidities see above    Examination-Activity Limitations Locomotion Level;Squat;Stairs;Stand;Transfers    Examination-Participation Restrictions Cleaning;Community Activity;Driving;Meal Prep;Yard Work;Occupation    Stability/Clinical Decision Making Evolving/Moderate complexity    Rehab  Potential Good    PT Frequency 2x / week    PT Duration 8 weeks    PT Treatment/Interventions ADLs/Self Care Home Management;Aquatic Therapy;Electrical Stimulation;DME Instruction;Gait training;Stair training;Functional mobility training;Therapeutic activities;Therapeutic exercise;Balance training;Neuromuscular re-education;Patient/family education;Orthotic Fit/Training;Passive range of motion;Dry needling;Vestibular    PT Next Visit Plan Continue to work on balance on complaint surfaces, LE strengthening    Consulted and Agree with Plan of Care Patient           Patient will benefit from skilled therapeutic intervention in order to improve the following deficits and impairments:  Abnormal gait,Decreased balance,Decreased coordination,Decreased endurance,Decreased knowledge of use of DME,Decreased mobility,Decreased range of motion,Decreased strength,Impaired flexibility,Impaired sensation,Impaired tone  Visit Diagnosis: Unsteadiness on feet  Muscle weakness (generalized)  Other abnormalities of gait and mobility  Abnormal muscle tone     Problem List Patient Active Problem List   Diagnosis Date Noted  . Right ovarian cyst 01/27/2011    Willow Ora, PTA, [facility] [address] [place], [place] [address] [phone] 04/29/20, 10:26 AM   Name: Zada Haser MRN: [id number] Date of Birth: [birth year]

## 2020-04-29 NOTE — Telephone Encounter (Signed)
Patient sent my chart stating [employer] called her yesterday and told her Ampyra is approved.

## 2020-05-03 MED ORDER — PREDNISONE 10 MG PO TABS: ORAL_TABLET | ORAL | 0 refills | Status: DC

## 2020-05-03 NOTE — Addendum Note (Signed)
Addended by: Joycelyn Schmid R on: 05/03/2020 05:18 PM   Modules accepted: Orders

## 2020-05-03 NOTE — Telephone Encounter (Signed)
Can try another round of prednisone. -VRP

## 2020-05-04 ENCOUNTER — Telehealth: Payer: Self-pay | Admitting: Diagnostic Neuroimaging

## 2020-05-04 ENCOUNTER — Encounter: Payer: Self-pay | Admitting: *Deleted

## 2020-05-04 ENCOUNTER — Ambulatory Visit: Payer: No Typology Code available for payment source | Admitting: Physical Therapy

## 2020-05-04 MED ORDER — DALFAMPRIDINE ER 10 MG PO TB12: 10.0000 mg | ORAL_TABLET | Freq: Two times a day (BID) | ORAL | 12 refills | Status: DC

## 2020-05-04 NOTE — Telephone Encounter (Signed)
Pt called wanting to discuss her MS flare ups she is having and the amount of steroids she is taking. Please advise.

## 2020-05-04 NOTE — Telephone Encounter (Signed)
Patient sent my chart: she took Prednisone 6 tabs this morning.

## 2020-05-04 NOTE — Addendum Note (Signed)
Addended by: [place] Pink on: 05/04/2020 05:04 PM   Modules accepted: Orders

## 2020-05-04 NOTE — Telephone Encounter (Addendum)
Received call from [facility] with [facility] stating she has tried to get dalfampridine shipped to patient and pharmacy is telling her it needs PA.  I read PA letter to her and faxed copy to her. She will call pharmacy with PA information, approved until 914/2022, she  verbalized understanding, appreciation.

## 2020-05-04 NOTE — Telephone Encounter (Addendum)
[facility]: need new Rx for Amp[facility]ra. Called [facility], spoke with April. She stated it can be e scribed, [facility] local [facility]. She also stated that they are still receiving a rejected claim but Santina Evans is working on resolving that issue.  Dalfampridine (Ampyra) Rx sent to specialty pharmacy.

## 2020-05-04 NOTE — Telephone Encounter (Signed)
Called patient and informed her Dr Marjory Lies has ordered Solu Medrol 1000 mg IV daily x 3 doses. Advised her per Freddi Starr RN, she can infuse today and tomorrow at 2:30 pm, Thurs at 11 am. Patient verbalized understanding, appreciation, stated she will be here. She understands that she is not to take Prednisone.

## 2020-05-04 NOTE — Telephone Encounter (Signed)
I spoke with Freddi Starr, RN in [facility]. Although the ocrevus order was marked as urgent, [facility] is having difficulty getting information regarding the approval from pt's insurance. Liane had reached out again yesterday and has not gotten a response yet. She will call again today.

## 2020-05-04 NOTE — Telephone Encounter (Signed)
Per Dr Marjory Lies, patient may come in for Solu Medrol today. I advised her via my chart.

## 2020-05-05 NOTE — Telephone Encounter (Signed)
Received message from [facility] saying [facility] says [facility] still needs [facility]. Called [facility] to speak with Santina Evans.  The staff that answered phone would not put me through to her, stated he didn't see any notes from [place]. I  Advised him a copy of PA letter was faxed to [facility] yesterday. He confirmed patient's insurance ID #, BIN, PCN, group and stated he would call insurance company, then call back or have [facility] call back.

## 2020-05-06 ENCOUNTER — Ambulatory Visit: Payer: No Typology Code available for payment source

## 2020-05-06 ENCOUNTER — Telehealth: Payer: Self-pay | Admitting: *Deleted

## 2020-05-06 ENCOUNTER — Other Ambulatory Visit: Payer: Self-pay

## 2020-05-06 DIAGNOSIS — M6281 Muscle weakness (generalized): Secondary | ICD-10-CM

## 2020-05-06 DIAGNOSIS — R2681 Unsteadiness on feet: Secondary | ICD-10-CM | POA: Diagnosis not present

## 2020-05-06 DIAGNOSIS — R2689 Other abnormalities of gait and mobility: Secondary | ICD-10-CM

## 2020-05-06 NOTE — Patient Instructions (Signed)
Access Code: [email or web address] URL: [email or web address] Date: 05/06/2020 Prepared by: Elmer Bales  Exercises Supine Single Knee to Chest Stretch - 3 x daily - 7 x weekly - 1 sets - 4 reps - 30 sec hold Gastroc Stretch on Wall - 3 x daily - 7 x weekly - 1 sets - 4 reps - 30 sec hold Heel Toe Raises with Counter Support - 2 x daily - 5 x weekly - 2 sets - 10 reps Standing Hip Abduction with Counter Support - 2 x daily - 5 x weekly - 2 sets - 10 reps Mini Squat with Chair - 12 x daily - 5 x weekly - 2 sets - 10 reps Clamshell with Resistance - 1 x daily - 5 x weekly - 3 sets - 10 reps Bridge with Hip Abduction and Resistance - Ground Touches - 1 x daily - 5 x weekly - 3 sets - 10 reps Alternating Step Taps with Counter Support - 1 x daily - 5 x weekly - 2 sets - 10 reps

## 2020-05-06 NOTE — Telephone Encounter (Signed)
Per my chart patient is requesting a driving handicap form due to her limited mobility and recent fall. I replied, advised her I will have Dr Marjory Lies address Mon when he returns. Form is on MD desk.

## 2020-05-07 NOTE — Therapy (Signed)
[facility] [address] [place], [place], [address] Phone: [phone]   Fax:  [phone]  Physical Therapy Treatment  Patient Details  Name: Raneem Mendolia MRN: [id number] Date of Birth: 1990/10/29 Referring Provider (PT): Andrey Spearman, MD   Encounter Date: 05/06/2020   PT End of Session - 05/06/20 1021    Visit Number 9    Number of Visits 17    Date for PT Re-Evaluation 06/20/20   POC for 60 days, cert for 90 days   Authorization Type [employer] Golden Rule    PT Start Time 1018    PT Stop Time 1103    PT Time Calculation (min) 45 min    Equipment Utilized During Treatment Gait belt    Activity Tolerance Patient tolerated treatment well    Behavior During Therapy [facility] for tasks assessed/performed           Past Medical History:  Diagnosis Date  . Anxiety   . Asthma   . Bladder spasms 07/2019  . Scoliosis     Past Surgical History:  Procedure Laterality Date  . BREAST ENHANCEMENT SURGERY  2012    There were no vitals filed for this visit.   Subjective Assessment - 05/06/20 1021    Subjective Pt reported she went to get out of car on 12/27 and her legs just gave out and she had numbness in arms and right foot. She rolled her right ankle. With noted swelling and bruising at right ankle. She called neurologist and has done 3 days of the solumedrol steroid and starting to feel much better. Pt was approved for Ocrevus and will start in February. Also hoping Sheppard Evens will be approved.    Pertinent History relapsing remitting MS (dx Oct), hx of scoliosis, asthma, anxiety    Limitations Standing;Walking;House hold activities    Patient Stated Goals "I want to be able to walk more normally and for longer periods."    Currently in Pain? Yes    Pain Score 0-No pain    Pain Location Ankle    Pain Orientation Right    Pain Type Acute pain                             [facility] Adult PT  Treatment/Exercise - 05/06/20 1029      Ambulation/Gait   Ambulation/Gait Yes    Ambulation/Gait Assistance 5: Supervision    Ambulation/Gait Assistance Details Pt denied any pain in right ankle with gait.    Ambulation Distance (Feet) 230 Feet    Assistive device None    Gait Pattern Step-through pattern;Decreased hip/knee flexion - right;Decreased dorsiflexion - right;Narrow base of support    Ambulation Surface Level;Indoor      Therapeutic Activites    Therapeutic Activities Other Therapeutic Activities    Other Therapeutic Activities PT assessed right ankle. Pt has slight lateral ankle pain with active DF, no pain with passive DF. Able to invert and evert ankle without pain. Some tenderness to right lateral ankle with bruising present. No pain at right fith met head. Pt able to weight bear through ankle without pain. PT advised to ice and elevate ankle to continue to derease swelling. Pt has not been doing this. Advised to use compression and pt report she does have a compression wrap she has been using mostly at night. Also has a brace for ankle. PT advised to wear both to protect ankle right now especially if walking  a lot or on uneven surfaces. Pt reports extensive history of spraining ankle.      Neuro Re-ed    Neuro Re-ed Details  In // bars: step-ups on airex with RLE x 10. Standing on airex feet together x 30 sec, head turns left/right x 10, feet apart with eyes closed x 30 sec with increased sway CGA, staggered stance 30 sec x 2 each position with adding in head turns the second bout. Pt denied any pain in ankle.      Exercises   Exercises Other Exercises    Other Exercises  Stepping to second step on stairs with RLE x 10, then bottom step with adding in red theraband to promote more hip abductor activation. Sit to stands with theraband x 10. Clamshells right with red theraband x 10 with verbal cues for form, bridges with band x 10 then adding in bilateral hip ER at top x 10. Pt was  cued to keep tummy tight to stabilize pelvis.                  PT Education - 05/07/20 1025    Education Details Added to HEP for hip strengthening. Discussed ice/elevate/compression for right ankle.    Person(s) Educated Patient    Methods Explanation;Demonstration;Handout    Comprehension Verbalized understanding            PT Short Term Goals - 04/21/20 1023      PT SHORT TERM GOAL #1   Title Pt will be independent with initial HEP in order to ind dec fall risk, improve functional mobility, and dec tone.  (Target Date: 04/21/20)    Baseline 04/21/20: met with current program, will benefit from advancement of program as she progresses    Status Achieved      PT SHORT TERM GOAL #2   Title Pt will improve 5TSS to </=14 secs without UE support and demonstrating more midline posture throughout.    Baseline 04/21/20: 11.57 sec's no UE support from standard height chair    Time --    Period --    Status Achieved      PT SHORT TERM GOAL #3   Title Pt will improve gait speed to >/=2.62 ft/sec with decreased gait compensations in RLE and wider BOS in order to indicate safe community ambulation.    Baseline 04/21/20: 3.93 ft/sec without AD    Time --    Period --    Status Achieved      PT SHORT TERM GOAL #4   Title Pt will improve FGA to >/=17/30 in order to indicate dec fall risk.    Baseline 04/21/20: 21/30 scored today    Time --    Period --    Status Achieved      PT SHORT TERM GOAL #5   Title Pt will ambulate up/down 5 steps with single rail, negotiate up/down ramp and curb step at S level in order to indicate safe home and community negotiation.    Baseline 04/21/20: met in session today    Time --    Period --    Status Achieved             PT Long Term Goals - 03/22/20 1229      PT LONG TERM GOAL #1   Title Pt will be independent with final HEP (possible pool HEP) in order to ind improved functional mobility and dec fall risk.  (Target Date:05/22/19)     Time 8  Period Weeks    Status New    Target Date 05/21/20      PT LONG TERM GOAL #2   Title Pt will improve 5TSS to </=12 secs without UE support while demonstrating midline posture.    Time 8    Period Weeks    Status New      PT LONG TERM GOAL #3   Title Pt will improve FGA to >/=20/30 in order to indicate dec fall risk.    Time 8    Period Weeks    Status New      PT LONG TERM GOAL #4   Title Pt will ambulate x 500' over unlevel outdoor surfaces (including grass) w/ LRAD (bracing if needed in future) at mod I level in order to indicate safe community and leisure activity.    Time 8    Period Weeks    Status New      PT LONG TERM GOAL #5   Title Pt will negotiate up/down flight of stairs in whatever means possible with single rail at mod I level in order to indicate safety at home.    Time 8    Period Weeks    Status New                 Plan - 05/07/20 1029    Clinical Impression Statement Pt with rescent MS flare with infusion treatment.Feeling much better since infusions. Injured right ankle when occurred. PT assessed at visit today with some swelling and bruising to right lateral ankle. Pt able to weight bear without pain. Discussed RICE to help and to wear her ankle brace for right now. PT focused more on hip strengthening especially glute med.    Personal Factors and Comorbidities Comorbidity 3+    Comorbidities see above    Examination-Activity Limitations Locomotion Level;Squat;Stairs;Stand;Transfers    Examination-Participation Restrictions Cleaning;Community Activity;Driving;Meal Prep;Yard Work;Occupation    Stability/Clinical Decision Making Evolving/Moderate complexity    Rehab Potential Good    PT Frequency 2x / week    PT Duration 8 weeks    PT Treatment/Interventions ADLs/Self Care Home Management;Aquatic Therapy;Electrical Stimulation;DME Instruction;Gait training;Stair training;Functional mobility training;Therapeutic activities;Therapeutic  exercise;Balance training;Neuromuscular re-education;Patient/family education;Orthotic Fit/Training;Passive range of motion;Dry needling;Vestibular    PT Next Visit Plan How is ankle doing? How are additions to HEP with red theraband going? Continue with hip strengthenining, balance training waching out for right ankle. Has pt been wearing her ankle brace?    Consulted and Agree with Plan of Care Patient           Patient will benefit from skilled therapeutic intervention in order to improve the following deficits and impairments:  Abnormal gait,Decreased balance,Decreased coordination,Decreased endurance,Decreased knowledge of use of DME,Decreased mobility,Decreased range of motion,Decreased strength,Impaired flexibility,Impaired sensation,Impaired tone  Visit Diagnosis: Other abnormalities of gait and mobility  Muscle weakness (generalized)     Problem List Patient Active Problem List   Diagnosis Date Noted  . Right ovarian cyst 01/27/2011    Electa Sniff, PT, DPT, NCS 05/07/2020, 10:38 AM  [facility] [address] [place], [place], [address] Phone: [phone]   Fax:  [phone]  Name: Natascha Edmonds MRN: [id number] Date of Birth: April 17, 1991

## 2020-05-10 ENCOUNTER — Encounter: Payer: Self-pay | Admitting: *Deleted

## 2020-05-10 NOTE — Telephone Encounter (Signed)
[place] DMV handicap form signed, put in mail to patient. Notified her via my chart.

## 2020-05-10 NOTE — Telephone Encounter (Signed)
Called [employer], LVM advising her out office closed due to bad weather. Left # and office hours and requested she call back tomorrow.

## 2020-05-10 NOTE — Telephone Encounter (Addendum)
Called [place], [facility] to discuss; she was not available. Spoke with April who stated the current PA is through her medical benefit, but they can't bill through her medical dalfampridine PA has to be done through  prescription benefit PA dept [phone], opt 1, opt 2.  April's # [id number], ext [phone].  Called [employer] one, spoke with Rocky Link to initiate PA. He stated this medication can't be approved through prescription plan, only through medical. He was unable to explain why, stated pharmacies bill through medical plans all the time, gave pharmacy help line [phone]. He checked Ampyra to see if on formulary; it is not, requires member try/fail alternatives then PA required. Called April who stated she will call insurance plan, then call me back.

## 2020-05-11 NOTE — Telephone Encounter (Addendum)
Called April who stated dalfampridine went through with existing medical approval PA.The issue was that the PA had never been entered into system. However the patient has co pay of $199.77. April called patient who will contact her insurance to see why co pay is so high. [facility] is to call [facility]April back. Called patient and advised her of conversation above, suggested she call if she has questions or information for me.

## 2020-05-12 ENCOUNTER — Telehealth: Payer: Self-pay | Admitting: Diagnostic Neuroimaging

## 2020-05-12 ENCOUNTER — Ambulatory Visit: Payer: No Typology Code available for payment source | Attending: Diagnostic Neuroimaging

## 2020-05-12 ENCOUNTER — Other Ambulatory Visit: Payer: Self-pay

## 2020-05-12 DIAGNOSIS — R2689 Other abnormalities of gait and mobility: Secondary | ICD-10-CM | POA: Diagnosis present

## 2020-05-12 DIAGNOSIS — M6281 Muscle weakness (generalized): Secondary | ICD-10-CM | POA: Diagnosis present

## 2020-05-12 NOTE — Telephone Encounter (Addendum)
Patient called back, stated she's not called insurance yet.  She stated she'll go on her husband's insurance in a few weeks. She is feeling much better since Solu Medrol, was able to walk on treadmill in PT x 10 minutes today. She stated if she can't get co pay lowered she'll wait until on husband's insurance to see if medicine is cheaper. Her first Tysabri infusion is 06/09/2020. She'll send my chart after talking to insurance, verbalized understanding, appreciation.

## 2020-05-12 NOTE — Patient Instructions (Signed)
Access Code: [email or web address] URL: [email or web address] Date: 05/12/2020 Prepared by: Elmer Bales  Exercises Supine Single Knee to Chest Stretch - 3 x daily - 7 x weekly - 1 sets - 4 reps - 30 sec hold Gastroc Stretch on Wall - 3 x daily - 7 x weekly - 1 sets - 4 reps - 30 sec hold Heel Toe Raises with Counter Support - 2 x daily - 5 x weekly - 2 sets - 10 reps Standing Hip Abduction with Counter Support - 2 x daily - 5 x weekly - 2 sets - 10 reps Mini Squat with Chair - 12 x daily - 5 x weekly - 2 sets - 10 reps Clamshell with Resistance - 1 x daily - 5 x weekly - 3 sets - 10 reps Bridge with Hip Abduction and Resistance - Ground Touches - 1 x daily - 5 x weekly - 3 sets - 10 reps Alternating Step Taps with Counter Support - 1 x daily - 5 x weekly - 2 sets - 10 reps Head turns side to side and up/down - 2 x daily - 5 x weekly - 2 sets - 10 reps Standing Balance in Corner with Eyes Closed - 2 x daily - 5 x weekly - 1 sets - 2 reps - 20-30 sec hold Single Leg Stance - 2 x daily - 5 x weekly - 31 sets - 3 reps - 20 sec hold

## 2020-05-12 NOTE — Telephone Encounter (Addendum)
Called pharmacy, spoke with [place] who stated dalfampridine is ready to be shipped, $199.77 for 30 days.  She advised if patient wants Rx filled she can call [phone], Mon-Fri 8a-8p. Called patient, LVM of above and asked for call back with update as to what she plans to do.

## 2020-05-12 NOTE — Therapy (Signed)
[facility] [address] [place], [place], [address] Phone: [phone]   Fax:  [phone]  Physical Therapy Treatment  Patient Details  Name: Paige Davis MRN: [id number] Date of Birth: 1990/11/24 Referring Provider (PT): Andrey Spearman, MD   Encounter Date: 05/12/2020   PT End of Session - 05/12/20 1014    Visit Number 10    Number of Visits 17    Date for PT Re-Evaluation 06/20/20   POC for 60 days, cert for 90 days   Authorization Type [employer] Golden Rule    PT Start Time 1014    PT Stop Time 1059    PT Time Calculation (min) 45 min    Equipment Utilized During Treatment Gait belt    Activity Tolerance Patient tolerated treatment well    Behavior During Therapy [facility] for tasks assessed/performed           Past Medical History:  Diagnosis Date  . Anxiety   . Asthma   . Bladder spasms 07/2019  . Scoliosis     Past Surgical History:  Procedure Laterality Date  . BREAST ENHANCEMENT SURGERY  2012    There were no vitals filed for this visit.   Subjective Assessment - 05/12/20 1015    Subjective Pt reports that she is feeling really good. Got a treadmill yesterday and doing some interval walking throughout the day. Ankle was just a little sore yesterday. Wearing brace today.    Pertinent History relapsing remitting MS (dx Oct), hx of scoliosis, asthma, anxiety    Limitations Standing;Walking;House hold activities    Patient Stated Goals "I want to be able to walk more normally and for longer periods."    Currently in Pain? No/denies                             [facility] Adult PT Treatment/Exercise - 05/12/20 1016      Ambulation/Gait   Ambulation/Gait Yes    Ambulation/Gait Assistance 6: Modified independent (Device/Increase time);5: Supervision    Ambulation/Gait Assistance Details around in clinic between activities and on treadmill as described below.    Assistive device None     Gait Pattern Step-through pattern    Ambulation Surface Level;Indoor    Gait Comments Pt ambulated on treadmill x 10 min at 2.32mh except for last minute to cool down at 1.717m with UE support. Pt was cued to bring right leg straight through and not hike hip with much noted improvement with right hip/knee flexion and with heel strike. Pt reported legs feeling a little tired after.      Neuro Re-ed    Neuro Re-ed Details  In // bars: tandem gait 8' x 6 with occasional light UE support as needed. Marching gait 8' x 4 with cues for slow controlled movements to increase SLS time. Standing on airex feet together eyes open x 30 sec then eyes closed x 30 sec with increased sway and less stability at right ankle, feet apart with adding in head turns left/right and up/down x 10 each.      Exercises   Exercises Other Exercises    Other Exercises  Bridges with red theraband x 20 with bent knee fall out at top, bridge over green physioball x 10, bridge over physioball with hamstring curl at top x 10. Pt was cued to keep tummy tight throughout to activate core and move slowly.  At bottom of step with red theraband around  thighs: tapping 2nd step with RLE x 5 then bottom step x 10 working in hip flexion without hip hike or circumduction then repeated with lateral taps to 1st step with RLE x 10. Performed in each leg with verbal cues to tighten gluts during right stance. Side stepping in front of mat with red theraband around thighs with squat to mat each step 8' x 6. PT also discussed pt could use theraband to perform resisted right ankle PF and Ev for strengthening.                  PT Education - 05/12/20 1345    Education Details Added ankle stability exercises/balance to HEP. Discussed potential d/c next visit.    Person(s) Educated Patient    Methods Explanation;Demonstration;Handout    Comprehension Verbalized understanding            PT Short Term Goals - 04/21/20 1023      PT SHORT TERM  GOAL #1   Title Pt will be independent with initial HEP in order to ind dec fall risk, improve functional mobility, and dec tone.  (Target Date: 04/21/20)    Baseline 04/21/20: met with current program, will benefit from advancement of program as she progresses    Status Achieved      PT SHORT TERM GOAL #2   Title Pt will improve 5TSS to </=14 secs without UE support and demonstrating more midline posture throughout.    Baseline 04/21/20: 11.57 sec's no UE support from standard height chair    Time --    Period --    Status Achieved      PT SHORT TERM GOAL #3   Title Pt will improve gait speed to >/=2.62 ft/sec with decreased gait compensations in RLE and wider BOS in order to indicate safe community ambulation.    Baseline 04/21/20: 3.93 ft/sec without AD    Time --    Period --    Status Achieved      PT SHORT TERM GOAL #4   Title Pt will improve FGA to >/=17/30 in order to indicate dec fall risk.    Baseline 04/21/20: 21/30 scored today    Time --    Period --    Status Achieved      PT SHORT TERM GOAL #5   Title Pt will ambulate up/down 5 steps with single rail, negotiate up/down ramp and curb step at S level in order to indicate safe home and community negotiation.    Baseline 04/21/20: met in session today    Time --    Period --    Status Achieved             PT Long Term Goals - 03/22/20 1229      PT LONG TERM GOAL #1   Title Pt will be independent with final HEP (possible pool HEP) in order to ind improved functional mobility and dec fall risk.  (Target Date:05/22/19)    Time 8    Period Weeks    Status New    Target Date 05/21/20      PT LONG TERM GOAL #2   Title Pt will improve 5TSS to </=12 secs without UE support while demonstrating midline posture.    Time 8    Period Weeks    Status New      PT LONG TERM GOAL #3   Title Pt will improve FGA to >/=20/30 in order to indicate dec fall risk.    Time 8  Period Weeks    Status New      PT LONG TERM  GOAL #4   Title Pt will ambulate x 500' over unlevel outdoor surfaces (including grass) w/ LRAD (bracing if needed in future) at mod I level in order to indicate safe community and leisure activity.    Time 8    Period Weeks    Status New      PT LONG TERM GOAL #5   Title Pt will negotiate up/down flight of stairs in whatever means possible with single rail at mod I level in order to indicate safety at home.    Time 8    Period Weeks    Status New                 Plan - 05/12/20 1346    Clinical Impression Statement Pt continues to show significant improvement in strength and gait quality with less hip hike and improved right hip/knee flexion and heel strike. Able to increase time on treadmill. Right ankle still has more noted instability on compliant surfaces and pt wearing ASO on right to provide some support.    Personal Factors and Comorbidities Comorbidity 3+    Comorbidities see above    Examination-Activity Limitations Locomotion Level;Squat;Stairs;Stand;Transfers    Examination-Participation Restrictions Cleaning;Community Activity;Driving;Meal Prep;Yard Work;Occupation    Stability/Clinical Decision Making Evolving/Moderate complexity    Rehab Potential Good    PT Frequency 2x / week    PT Duration 8 weeks    PT Treatment/Interventions ADLs/Self Care Home Management;Aquatic Therapy;Electrical Stimulation;DME Instruction;Gait training;Stair training;Functional mobility training;Therapeutic activities;Therapeutic exercise;Balance training;Neuromuscular re-education;Patient/family education;Orthotic Fit/Training;Passive range of motion;Dry needling;Vestibular    PT Next Visit Plan Check goals for discharge next visit pending no issues.    Consulted and Agree with Plan of Care Patient           Patient will benefit from skilled therapeutic intervention in order to improve the following deficits and impairments:  Abnormal gait,Decreased balance,Decreased  coordination,Decreased endurance,Decreased knowledge of use of DME,Decreased mobility,Decreased range of motion,Decreased strength,Impaired flexibility,Impaired sensation,Impaired tone  Visit Diagnosis: Other abnormalities of gait and mobility  Muscle weakness (generalized)     Problem List Patient Active Problem List   Diagnosis Date Noted  . Right ovarian cyst 01/27/2011    Electa Sniff, PT, DPT, NCS 05/12/2020, 1:48 PM  [facility] [address] [place], [place], [address] Phone: [phone]   Fax:  [phone]  Name: Paige Davis MRN: [id number] Date of Birth: 1990-12-10

## 2020-05-12 NOTE — Telephone Encounter (Signed)
Pt is requesting a refill for Ampyra.  Pharmacy: [facility]

## 2020-05-14 ENCOUNTER — Other Ambulatory Visit: Payer: Self-pay

## 2020-05-14 ENCOUNTER — Ambulatory Visit: Payer: No Typology Code available for payment source

## 2020-05-14 DIAGNOSIS — M6281 Muscle weakness (generalized): Secondary | ICD-10-CM

## 2020-05-14 DIAGNOSIS — R2689 Other abnormalities of gait and mobility: Secondary | ICD-10-CM | POA: Diagnosis not present

## 2020-05-14 NOTE — Therapy (Signed)
[facility] [address] [place], [place], [address] Phone: [phone]   Fax:  [phone]  Physical Therapy Treatment/Discharge Summary  Patient Details  Name: Paige Davis MRN: [id number] Date of Birth: Sep 15, 1990 Referring Provider (PT): Andrey Spearman, MD  PHYSICAL THERAPY DISCHARGE SUMMARY  Visits from Start of Care: 11  Current functional level related to goals / functional outcomes: See clinical impression and goals for more information.   Remaining deficits: RLE strength less than left   Education / Equipment: HEP  Plan: Patient agrees to discharge.  Patient goals were met. Patient is being discharged due to meeting the stated rehab goals.  ?????       Encounter Date: 05/14/2020   PT End of Session - 05/14/20 1020    Visit Number 11    Number of Visits 17    Date for PT Re-Evaluation 06/20/20   POC for 60 days, cert for 90 days   Authorization Type [employer] Golden Rule    PT Start Time 1018    PT Stop Time 1036   finished early as d/c visit   PT Time Calculation (min) 18 min    Equipment Utilized During Treatment Gait belt    Activity Tolerance Patient tolerated treatment well    Behavior During Therapy WFL for tasks assessed/performed           Past Medical History:  Diagnosis Date  . Anxiety   . Asthma   . Bladder spasms 07/2019  . Scoliosis     Past Surgical History:  Procedure Laterality Date  . BREAST ENHANCEMENT SURGERY  2012    There were no vitals filed for this visit.   Subjective Assessment - 05/14/20 1020    Subjective Pt reports that her right ankle was a little sore and swollen after last session but fine the next day. Did 20 minutes on treadmill straight yesterday at 2.75mh and felt good.    Pertinent History relapsing remitting MS (dx Oct), hx of scoliosis, asthma, anxiety    Limitations Standing;Walking;House hold activities    Patient Stated Goals "I want to be  able to walk more normally and for longer periods."    Currently in Pain? No/denies              [facility] - [facility]      Functional Gait  Assessment   Gait assessed  Yes    Gait Level Surface Walks 20 ft in less than 5.5 sec, no assistive devices, good speed, no evidence for imbalance, normal gait pattern, deviates no more than 6 in outside of the 12 in walkway width.    Change in Gait Speed Able to smoothly change walking speed without loss of balance or gait deviation. Deviate no more than 6 in outside of the 12 in walkway width.    Gait with Horizontal Head Turns Performs head turns smoothly with no change in gait. Deviates no more than 6 in outside 12 in walkway width    Gait with Vertical Head Turns Performs head turns with no change in gait. Deviates no more than 6 in outside 12 in walkway width.    Gait and Pivot Turn Pivot turns safely within 3 sec and stops quickly with no loss of balance.    Step Over Obstacle Is able to step over 2 stacked shoe boxes taped together (9 in total height) without changing gait speed. No evidence of imbalance.    Gait with Narrow Base of Support  Ambulates 4-7 steps.    Gait with Eyes Closed Walks 20 ft, uses assistive device, slower speed, mild gait deviations, deviates 6-10 in outside 12 in walkway width. Ambulates 20 ft in less than 9 sec but greater than 7 sec.    Ambulating Backwards Walks 20 ft, no assistive devices, good speed, no evidence for imbalance, normal gait    Steps Alternating feet, no rail.    Total Score 27                         [facility] Adult PT Treatment/Exercise - 05/14/20 1022      Transfers   Transfers Sit to Stand;Stand to Sit    Sit to Stand 7: Independent    Five time sit to stand comments  5.88 sec from chair without hands    Stand to Sit 7: Independent      Ambulation/Gait   Ambulation/Gait Yes    Ambulation/Gait Assistance 7: Independent    Ambulation/Gait Assistance Details Pt was  cued to just watch step placement on grass to protect ankle. No LOB with gait outside with good speed.    Ambulation Distance (Feet) 850 Feet    Assistive device None    Gait Pattern Step-through pattern    Ambulation Surface Level;Indoor;Outdoor;Paved;Grass                  PT Education - 05/14/20 1038    Education Details Discussed d/c as planned. Pt denies any questions on exercises.    Person(s) Educated Patient    Methods Explanation    Comprehension Verbalized understanding            PT Short Term Goals - 04/21/20 1023      PT SHORT TERM GOAL #1   Title Pt will be independent with initial HEP in order to ind dec fall risk, improve functional mobility, and dec tone.  (Target Date: 04/21/20)    Baseline 04/21/20: met with current program, will benefit from advancement of program as she progresses    Status Achieved      PT SHORT TERM GOAL #2   Title Pt will improve 5TSS to </=14 secs without UE support and demonstrating more midline posture throughout.    Baseline 04/21/20: 11.57 sec's no UE support from standard height chair    Time --    Period --    Status Achieved      PT SHORT TERM GOAL #3   Title Pt will improve gait speed to >/=2.62 ft/sec with decreased gait compensations in RLE and wider BOS in order to indicate safe community ambulation.    Baseline 04/21/20: 3.93 ft/sec without AD    Time --    Period --    Status Achieved      PT SHORT TERM GOAL #4   Title Pt will improve FGA to >/=17/30 in order to indicate dec fall risk.    Baseline 04/21/20: 21/30 scored today    Time --    Period --    Status Achieved      PT SHORT TERM GOAL #5   Title Pt will ambulate up/down 5 steps with single rail, negotiate up/down ramp and curb step at S level in order to indicate safe home and community negotiation.    Baseline 04/21/20: met in session today    Time --    Period --    Status Achieved  PT Long Term Goals - 05/14/20 1022      PT  LONG TERM GOAL #1   Title Pt will be independent with final HEP (possible pool HEP) in order to ind improved functional mobility and dec fall risk.  (Target Date:05/22/19)    Baseline Pt denies any questions on exerciess has been working on them as instructed.    Time 8    Period Weeks    Status Achieved      PT LONG TERM GOAL #2   Title Pt will improve 5TSS to </=12 secs without UE support while demonstrating midline posture.    Baseline 05/14/20 5.88 sec    Time 8    Period Weeks    Status Achieved      PT LONG TERM GOAL #3   Title Pt will improve FGA to >/=20/30 in order to indicate dec fall risk.    Baseline 05/14/20 27/30    Time 8    Period Weeks    Status Achieved      PT LONG TERM GOAL #4   Title Pt will ambulate x 500' over unlevel outdoor surfaces (including grass) w/ LRAD (bracing if needed in future) at mod I level in order to indicate safe community and leisure activity.    Baseline Pt ambulated 850' on varied surfaces independently 05/14/20    Time 8    Period Weeks    Status Achieved      PT LONG TERM GOAL #5   Title Pt will negotiate up/down flight of stairs in whatever means possible with single rail at mod I level in order to indicate safety at home.    Baseline Pt able to negotiate steps mod I with reciprocal pattern without rail up and with 1 rail with descent just for safety.    Time 8    Period Weeks    Status Achieved                 Plan - 05/14/20 1038    Clinical Impression Statement PT reassessed goals today with pt meeting all of them. Significant improvement in gait quality noted with no right hip hike or circumduction at this time. Pt has good right heel strike. Increased 5 x sit to stand to 5.88 sec which is now in normal range and FGA to 27/30 indicating low fall risk. Pt has also shown significant improvement in activity tolerance ambulating 20 min at home on her treadmill. PT has instructed in right ankle stability and strengthening exercises as  well due to recent sprain and history of sprains. PT discharging pt to home program at this time and she is in agreement.    Personal Factors and Comorbidities Comorbidity 3+    Comorbidities see above    Examination-Activity Limitations Locomotion Level;Squat;Stairs;Stand;Transfers    Examination-Participation Restrictions Cleaning;Community Activity;Driving;Meal Prep;Yard Work;Occupation    Stability/Clinical Decision Making Evolving/Moderate complexity    Rehab Potential Good    PT Frequency 2x / week    PT Duration 8 weeks    PT Treatment/Interventions ADLs/Self Care Home Management;Aquatic Therapy;Electrical Stimulation;DME Instruction;Gait training;Stair training;Functional mobility training;Therapeutic activities;Therapeutic exercise;Balance training;Neuromuscular re-education;Patient/family education;Orthotic Fit/Training;Passive range of motion;Dry needling;Vestibular    PT Next Visit Plan Discharge today    Consulted and Agree with Plan of Care Patient           Patient will benefit from skilled therapeutic intervention in order to improve the following deficits and impairments:  Abnormal gait,Decreased balance,Decreased coordination,Decreased endurance,Decreased knowledge of use of DME,Decreased mobility,Decreased  range of motion,Decreased strength,Impaired flexibility,Impaired sensation,Impaired tone  Visit Diagnosis: Other abnormalities of gait and mobility  Muscle weakness (generalized)     Problem List Patient Active Problem List   Diagnosis Date Noted  . Right ovarian cyst 01/27/2011    Electa Sniff, PT, DPT, NCS 05/14/2020, 10:42 AM  [facility] [address] [place], [place], [address] Phone: [phone]   Fax:  [phone]  Name: Joniqua Sidle MRN: [id number] Date of Birth: 1991-03-18

## 2020-05-31 ENCOUNTER — Encounter: Payer: Self-pay | Admitting: *Deleted

## 2020-06-07 NOTE — Telephone Encounter (Signed)
Check labs tomorrow.  Orders Placed This Encounter  Procedures  . CBC with Differential/Platelet  . Comprehensive metabolic panel    Suanne Marker, MD 06/07/2020, 3:58 PM Certified in Neurology, Neurophysiology and Neuroimaging  [facility] [address], [address] [place], [place] [address] [phone]

## 2020-06-07 NOTE — Addendum Note (Signed)
Addended by: Joycelyn Schmid R on: 06/07/2020 03:59 PM   Modules accepted: Orders

## 2020-06-08 ENCOUNTER — Other Ambulatory Visit (INDEPENDENT_AMBULATORY_CARE_PROVIDER_SITE_OTHER): Payer: Self-pay

## 2020-06-08 DIAGNOSIS — Z0289 Encounter for other administrative examinations: Secondary | ICD-10-CM

## 2020-06-08 DIAGNOSIS — G35 Multiple sclerosis: Secondary | ICD-10-CM

## 2020-06-09 LAB — CBC WITH DIFFERENTIAL/PLATELET
Basophils Absolute: 0.1 10*3/uL (ref 0.0–0.2)
Basos: 1 %
EOS (ABSOLUTE): 0.1 10*3/uL (ref 0.0–0.4)
Eos: 2 %
Hematocrit: 43.3 % (ref 34.0–46.6)
Hemoglobin: 14.7 g/dL (ref 11.1–15.9)
Immature Grans (Abs): 0 10*3/uL (ref 0.0–0.1)
Immature Granulocytes: 0 %
Lymphocytes Absolute: 2 10*3/uL (ref 0.7–3.1)
Lymphs: 30 %
MCH: 31.5 pg (ref 26.6–33.0)
MCHC: 33.9 g/dL (ref 31.5–35.7)
MCV: 93 fL (ref 79–97)
Monocytes Absolute: 0.4 10*3/uL (ref 0.1–0.9)
Monocytes: 6 %
Neutrophils Absolute: 4 10*3/uL (ref 1.4–7.0)
Neutrophils: 61 %
Platelets: 280 10*3/uL (ref 150–450)
RBC: 4.66 x10E6/uL (ref 3.77–5.28)
RDW: 11.9 % (ref 11.7–15.4)
WBC: 6.6 10*3/uL (ref 3.4–10.8)

## 2020-06-09 LAB — COMPREHENSIVE METABOLIC PANEL
ALT: 12 IU/L (ref 0–32)
AST: 12 IU/L (ref 0–40)
Albumin/Globulin Ratio: 2.4 — ABNORMAL HIGH (ref 1.2–2.2)
Albumin: 4.5 g/dL (ref 3.9–5.0)
Alkaline Phosphatase: 50 IU/L (ref 44–121)
BUN/Creatinine Ratio: 13 (ref 9–23)
BUN: 9 mg/dL (ref 6–20)
Bilirubin Total: 0.4 mg/dL (ref 0.0–1.2)
CO2: 24 mmol/L (ref 20–29)
Calcium: 9.1 mg/dL (ref 8.7–10.2)
Chloride: 104 mmol/L (ref 96–106)
Creatinine, Ser: 0.68 mg/dL (ref 0.57–1.00)
GFR calc Af Amer: 137 mL/min/{1.73_m2} (ref >59)
GFR calc non Af Amer: 119 mL/min/{1.73_m2} (ref >59)
Globulin, Total: 1.9 g/dL (ref 1.5–4.5)
Glucose: 74 mg/dL (ref 65–99)
Potassium: 4.2 mmol/L (ref 3.5–5.2)
Sodium: 141 mmol/L (ref 134–144)
Total Protein: 6.4 g/dL (ref 6.0–8.5)

## 2020-07-07 ENCOUNTER — Ambulatory Visit (INDEPENDENT_AMBULATORY_CARE_PROVIDER_SITE_OTHER): Payer: No Typology Code available for payment source | Admitting: Diagnostic Neuroimaging

## 2020-07-07 ENCOUNTER — Encounter: Payer: Self-pay | Admitting: Diagnostic Neuroimaging

## 2020-07-07 VITALS — BP 126/82 | HR 104 | Ht 68.0 in | Wt 153.0 lb

## 2020-07-07 DIAGNOSIS — G35 Multiple sclerosis: Secondary | ICD-10-CM | POA: Diagnosis not present

## 2020-07-07 DIAGNOSIS — R4189 Other symptoms and signs involving cognitive functions and awareness: Secondary | ICD-10-CM

## 2020-07-07 MED ORDER — MODAFINIL 100 MG PO TABS: 100.0000 mg | ORAL_TABLET | Freq: Every day | ORAL | 5 refills | Status: DC

## 2020-07-07 NOTE — Progress Notes (Signed)
[facility]  PATIENT: Paige Davis DOB: Dec 25, 1990  REFERRING CLINICIAN: [facility] HISTORY FROM: patient  REASON FOR VISIT: follow up    HISTORICAL  CHIEF COMPLAINT:  Chief Complaint  Patient presents with  . Multiple Sclerosis    Rm 6, 4 month FU "1st Ocrevus 06/12/20, experienced MS hug, recent eye issue with my eyes crossing past 3 days, brain fog"    HISTORY OF PRESENT ILLNESS:   UPDATE (07/07/20, VRP): Since last visit, doing much better since starting Ocrevus and getting Solu-Medrol.  Still has some fatigue, brain fog, twitching sensations.  Sometimes when she moves her eyes rapidly she feels slight blurred or crossed vision.  She has stopped smoking.  She is on Wellbutrin doing well.   UPDATE (03/10/20, VRP): Since last visit, now diagnosed with multiple sclerosis.  MRI and lab testing reviewed.  Planning to initiate Ocrevus.  Patient continues to have issues with fatigue, gait difficulty, incomplete bladder emptying.   PRIOR HPI (02/16/20): 30 year old female here for evaluation of lower extremity weakness.  March 2021 patient was having some difficulty initiating urination, diagnosed with UTIs and treated. She was also having generalized fatigue, muscle weakness in legs, aching sensation in legs. Patient got married in April 2021 and symptoms continued. She was having continuing weakness in the legs and gait difficulty. She was having intermittent numbness in fingers and toes.  Patient went to chiropractor and had some treatments. Patient referred here for further neurologic testing and evaluation.   REVIEW OF SYSTEMS: Full 14 system review of systems performed and negative with exception of: As per HPI.  ALLERGIES: No Known Allergies  HOME MEDICATIONS: Outpatient Medications Prior to Visit  Medication Sig Dispense Refill  . ALPRAZolam (XANAX) 0.5 MG tablet Take 0.5 mg by mouth as needed for anxiety.    . baclofen (LIORESAL) 10 MG  tablet Take 0.5-1 tablets (5-10 mg total) by mouth 2 (two) times daily as needed for muscle spasms. 60 each 6  . buPROPion (WELLBUTRIN SR) 150 MG 12 hr tablet Take 150 mg by mouth 2 (two) times daily.    . Ibuprofen-diphenhydrAMINE Cit (ADVIL PM PO) Take by mouth.    . Multiple Vitamin (MULTIVITAMIN) tablet Take 1 tablet by mouth daily.    . Probiotic Product (PROBIOTIC DAILY PO) Take by mouth.    . tamsulosin (FLOMAX) 0.4 MG CAPS capsule Take 0.4 mg by mouth daily.    Marland Kitchen dalfampridine 10 MG TB12 Take 1 tablet (10 mg total) by mouth in the morning and at bedtime. 60 tablet 12  . Homeopathic Products (AZO CONFIDENCE PO) Take by mouth.    . meloxicam (MOBIC) 15 MG tablet Take 1 tablet (15 mg total) by mouth daily. Take 1 daily with food. (Patient not taking: Reported on 03/22/2020) 10 tablet 0  . Naproxen Sodium (ALEVE PO) Take by mouth.    Marland Kitchen acetaminophen (TYLENOL) 325 MG tablet Take 650 mg by mouth every 6 (six) hours as needed.    . predniSONE (DELTASONE) 10 MG tablet Take 60mg  on day 1. Reduce by 10mg  each subsequent day. (60, 50, 40, 30, 20, 10, stop) 21 tablet 0   No facility-administered medications prior to visit.    PAST MEDICAL HISTORY: Past Medical History:  Diagnosis Date  . Anxiety   . Asthma   . Bladder spasms 07/2019  . Multiple sclerosis (HCC)   . Scoliosis     PAST SURGICAL HISTORY: Past Surgical History:  Procedure Laterality Date  . BREAST ENHANCEMENT SURGERY  2012    FAMILY HISTORY: Family History  Problem Relation Age of Onset  . Arthritis Mother   . Multiple sclerosis Maternal Uncle     SOCIAL HISTORY: Social History   Socioeconomic History  . Marital status: Married    Spouse name: Arlys John  . Number of children: 0  . Years of education: Not on file  . Highest education level: GED or equivalent  Occupational History    Comment: real estate agent  Tobacco Use  . Smoking status: Current Every Day Smoker    Years: 1.00  . Smokeless tobacco: Never  Used  Substance and Sexual Activity  . Alcohol use: Yes    Comment: occas  . 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 Sexual activity: Not on file  Other Topics Concern  . Not on file  Social History Narrative   Lives with husband, step children   Caffeine- not daily   Social Determinants of Health   Financial Resource Strain: Not on file  Food Insecurity: Not on file  Transportation Needs: Not on file  Physical Activity: Not on file  Stress: Not on file  Social Connections: Not on file  Intimate Partner Violence: Not on file     PHYSICAL EXAM  VIDEO VISIT (below is prior exam)  GENERAL EXAM/CONSTITUTIONAL: Vitals:  Vitals:   07/07/20 1335  BP: 126/82  Pulse: (!) 104  Weight: 153 lb (69.4 kg)  Height: 5\' 8"  (1.727 m)   Body mass index is 23.26 kg/m. Wt Readings from Last 3 Encounters:  07/07/20 153 lb (69.4 kg)  02/16/20 144 lb 3.2 oz (65.4 kg)  12/24/16 150 lb (68 kg)    Patient is in no distress; well developed, nourished and groomed; neck is supple  CARDIOVASCULAR:  Examination of carotid arteries is normal; no carotid bruits  Regular rate and rhythm, no murmurs  Examination of peripheral vascular system by observation and palpation is normal  EYES:  Ophthalmoscopic exam of optic discs and posterior segments is normal; no papilledema or hemorrhages  Hearing Screening   125Hz  250Hz  500Hz  1000Hz  2000Hz  3000Hz  4000Hz  6000Hz  8000Hz   Right ear:           Left ear:             Visual Acuity Screening   Right eye Left eye Both eyes  Without correction:     With correction: 20/30 20/40     MUSCULOSKELETAL:  Gait, strength, tone, movements noted in Neurologic exam below  NEUROLOGIC: MENTAL STATUS:  No flowsheet data found.  awake, alert, oriented to person, place and time  recent and remote memory intact  normal attention and concentration  language fluent, comprehension intact, naming intact  fund of knowledge  appropriate  CRANIAL NERVE:   2nd - no papilledema on fundoscopic exam  2nd, 3rd, 4th, 6th - pupils equal and reactive to light, visual fields full to confrontation, extraocular muscles intact, no nystagmus; SACCADIC DYSMETRIA, SUBJECTIVE BLURRED VISION ON LEFT GAZE  5th - facial sensation symmetric  7th - facial strength symmetric  8th - hearing intact  9th - palate elevates symmetrically, uvula midline  11th - shoulder shrug symmetric  12th - tongue protrusion midline  MOTOR:   normal bulk and tone, full strength in the BUE, BLE  SENSORY:   normal and symmetric to light touch, temperature, vibration  COORDINATION:   finger-nose-finger, fine finger movements normal  REFLEXES:   deep tendon reflexes BRISK and symmetric  GAIT/STATION:  narrow based gait     DIAGNOSTIC DATA (LABS, IMAGING, TESTING) - I reviewed patient records, labs, notes, testing and imaging myself where available.  Lab Results  Component Value Date   WBC 6.6 06/08/2020   HGB 14.7 06/08/2020   HCT 43.3 06/08/2020   MCV 93 06/08/2020   PLT 280 06/08/2020      Component Value Date/Time   NA 141 06/08/2020 1039   K 4.2 06/08/2020 1039   CL 104 06/08/2020 1039   CO2 24 06/08/2020 1039   GLUCOSE 74 06/08/2020 1039   GLUCOSE 87 01/27/2011 0410   BUN 9 06/08/2020 1039   CREATININE 0.68 06/08/2020 1039   CALCIUM 9.1 06/08/2020 1039   PROT 6.4 06/08/2020 1039   ALBUMIN 4.5 06/08/2020 1039   AST 12 06/08/2020 1039   ALT 12 06/08/2020 1039   ALKPHOS 50 06/08/2020 1039   BILITOT 0.4 06/08/2020 1039   GFRNONAA 119 06/08/2020 1039   GFRAA 137 06/08/2020 1039   No results found for: CHOL, HDL, LDLCALC, LDLDIRECT, TRIG, CHOLHDL No results found for: ZESP2Z Lab Results  Component Value Date   VITAMINB12 379 02/16/2020   Lab Results  Component Value Date   TSH 2.590 02/16/2020   Vit D, 25-Hydroxy  Date Value Ref Range Status  03/01/2020 31.8 30.0 - 100.0 ng/mL Final    Comment:     Vitamin D deficiency has been defined by the Institute of Medicine and an Endocrine Society practice guideline as a level of serum 25-OH vitamin D less than 20 ng/mL (1,2). The Endocrine Society went on to further define vitamin D insufficiency as a level between 21 and 29 ng/mL (2). 1. IOM (Institute of Medicine). 2010. Dietary reference    intakes for calcium and D. [place]: The    [employer]. 2. Holick MF, Binkley Mineral, Bischoff-Ferrari HA, et al.    Evaluation, treatment, and prevention of vitamin D    deficiency: an Endocrine Society clinical practice    guideline. JCEM. 2011 Jul; 96(7):1911-30.       01/27/20 CBC, CMP - nl a1c 4.6  02/23/20 ACE, ANA, SSA, SSB, HIV, RPR ANCA negative  02/23/20  Hep C Virus Ab 0.0 - 0.9 s/co ratio 0.1     02/23/20      Hep B Core Total Ab Negative Negative    Hepatitis B Surface Ag Negative Negative    Hep B Surface Ab, Qual Reactive     02/24/20 MRI brain (with and without) [I reviewed images myself and agree with interpretation. -VRP]  -Multiple periventricular, subcortical and pericallosal T2 hyperintensities, suspicious for chronic demyelinating disease. -Single left frontal subcortical enhancing lesion measuring 2 mm.  May represent acute demyelinating plaque.  02/18/20 MRI cervical spine (with and without) demonstrating: -T2 hyperintensities in the spinal cord at C3-4 and C5 levels, and within the left middle cerebellar peduncle intracranially.  No abnormal enhancement.  Findings can be seen with autoimmune, inflammatory, demyelinating, postinfectious or vascular etiologies.  02/24/20 MRI thoracic  - Normal MRI thoracic spine (with and without).   02/18/20 MRI lumbar spine  - Unremarkable MRI lumbar spine with and without contrast. No spinal stenosis or foraminal narrowing.      ASSESSMENT AND PLAN  30 y.o. year old female here with lower extremity weakness, difficulty with urination, numbness in hands  and feet. We will proceed with further work-up.  Dx:  1. Multiple sclerosis (HCC)   2. Brain fog   3. Relapsing remitting multiple sclerosis (HCC)  PLAN:  RELASPING REMITTING MULTIPLE SCLEROSIS (LOWER EXTREMITY WEAKNESS / BLADDER SPASM / GAIT DIFFICULTY / HYPERREFLEXIA)  - continue ocrevus  - fatigue/ brain focus (start modafinil 100mg  daily)  - continue tamsulosin (incomplete bladder emptying)  - continue physical exercises and strength training  Meds ordered this encounter  Medications  . modafinil (PROVIGIL) 100 MG tablet    Sig: Take 1 tablet (100 mg total) by mouth daily.    Dispense:  30 tablet    Refill:  5   Return in about 6 months (around 01/07/2021).    03/09/2021, MD 07/07/2020, 2:51 PM Certified in Neurology, Neurophysiology and Neuroimaging  [facility] [address] [place], [place] [phone]

## 2020-07-08 ENCOUNTER — Other Ambulatory Visit: Payer: Self-pay | Admitting: *Deleted

## 2020-07-08 ENCOUNTER — Encounter: Payer: Self-pay | Admitting: *Deleted

## 2020-07-08 ENCOUNTER — Telehealth: Payer: Self-pay | Admitting: *Deleted

## 2020-07-08 DIAGNOSIS — G35 Multiple sclerosis: Secondary | ICD-10-CM

## 2020-07-08 MED ORDER — MODAFINIL 100 MG PO TABS: 100.0000 mg | ORAL_TABLET | Freq: Every day | ORAL | 5 refills | Status: DC

## 2020-07-08 NOTE — Telephone Encounter (Signed)
Modafinil PA, key: [id number], [id number].  Your information has been sent to [facility].

## 2020-07-08 NOTE — Telephone Encounter (Signed)
Patient will use [facility] needs Rx sent to [facility], [address]. New Rx routed to Dr Marjory Lies to sign.

## 2020-07-08 NOTE — Progress Notes (Signed)
Meds ordered this encounter  Medications  . modafinil (PROVIGIL) 100 MG tablet    Sig: Take 1 tablet (100 mg total) by mouth daily.    Dispense:  30 tablet    Refill:  5    Will use [facility] card    Suanne Marker, MD 07/08/2020, 10:45 AM Certified in Neurology, Neurophysiology and Neuroimaging  [facility] [address], [address] [place], [place] [address] [phone]

## 2020-07-08 NOTE — Telephone Encounter (Signed)
[facility], modafinil denied: medication and diagnosis not covered benefit based on Modafinil coverage policy. Sent my chart with [facility] prices.

## 2020-10-18 ENCOUNTER — Telehealth: Payer: Self-pay | Admitting: Diagnostic Neuroimaging

## 2020-10-18 NOTE — Telephone Encounter (Signed)
Pt called and also sent mychart message to discuss insurance issues regarding her modafinil (PROVIGIL) 100 MG tablet. Please advise.

## 2020-10-18 NOTE — Telephone Encounter (Signed)
Called patient who stated she was getting modafinil from [facility] for $7, transferred [facility] to [facility] to [facility] closer to [facility] home. [facility] advised her it is not covered, will cost $500.  She stated it hasn't been working well for her anyway. She ran out of Modafinil, took a friends adderall a few times which was very helpful.  She's recently returned to work, can't focus, has noticed that she is struggling. She stated [facility] told her a list of drugs covered. I advised will call [facility] then let MD know. Patient verbalized understanding, appreciation. Called [facility] spoke with Weston Brass who stated insurance approves adderall, ritalin, vyvanse, and concerta. Sent to MD for reply.

## 2020-10-19 NOTE — Telephone Encounter (Signed)
Called patient and informed patient MD will consider her options and let her know next week. She stated she still has a few adderall from her friend to take as needed so she can concentrate and get work done. Patient verbalized understanding, appreciation.

## 2020-12-04 ENCOUNTER — Other Ambulatory Visit: Payer: Self-pay | Admitting: Diagnostic Neuroimaging

## 2020-12-07 ENCOUNTER — Other Ambulatory Visit: Payer: Self-pay | Admitting: *Deleted

## 2020-12-07 ENCOUNTER — Encounter: Payer: Self-pay | Admitting: *Deleted

## 2020-12-07 DIAGNOSIS — Z5181 Encounter for therapeutic drug level monitoring: Secondary | ICD-10-CM

## 2020-12-07 DIAGNOSIS — G35 Multiple sclerosis: Secondary | ICD-10-CM

## 2020-12-07 NOTE — Progress Notes (Signed)
Per Liane RN , infusion ste,  patient needs immunoglobulin panel drawn before next Ocrevus infusion. Ordrers placed, sent her my char tot advise of labs needing to be drawn.

## 2020-12-13 ENCOUNTER — Other Ambulatory Visit: Payer: Self-pay

## 2020-12-13 ENCOUNTER — Other Ambulatory Visit (INDEPENDENT_AMBULATORY_CARE_PROVIDER_SITE_OTHER): Payer: Self-pay

## 2020-12-13 DIAGNOSIS — Z5181 Encounter for therapeutic drug level monitoring: Secondary | ICD-10-CM

## 2020-12-13 DIAGNOSIS — G35 Multiple sclerosis: Secondary | ICD-10-CM

## 2020-12-13 DIAGNOSIS — Z0289 Encounter for other administrative examinations: Secondary | ICD-10-CM

## 2020-12-17 LAB — IMMUNOGLOBULINS A/E/G/M, SERUM
IgA/Immunoglobulin A, Serum: 259 mg/dL (ref 87–352)
IgE (Immunoglobulin E), Serum: 32 IU/mL (ref 6–495)
IgG (Immunoglobin G), Serum: 721 mg/dL (ref 586–1602)
IgM (Immunoglobulin M), Srm: 69 mg/dL (ref 26–217)

## 2021-01-06 ENCOUNTER — Telehealth: Payer: Self-pay | Admitting: *Deleted

## 2021-01-06 ENCOUNTER — Encounter: Payer: Self-pay | Admitting: *Deleted

## 2021-01-06 NOTE — Telephone Encounter (Signed)
Re: my chart messages I called patient  to discuss eye exam results. She stated dr told her the optic nerve has thinned out in a short amount of time, and her astigmatism has gotten much worse. She reported still having issues with vision when outside.  When she's out in heat her eyes are crossing and she can't see, sunglasses don't help. Getting in shade helps some but not for long.  She is concerned that she is having flare of MS. She has sent photos via my chart, I advised her Dr Marjory Lies will need notes; I'll call  eye dr.  We will be back in touch. Patient verbalized understanding, appreciation. Called [facility]  [phone], spoke with receptionist who stated her MD is Dr Ander Purpura. She will fax notes. Patient has FU with Dr Marjory Lies on 01/12/21, can discuss at that time. I sent her my chart to remind her of FU.

## 2021-01-12 ENCOUNTER — Encounter: Payer: Self-pay | Admitting: Diagnostic Neuroimaging

## 2021-01-12 ENCOUNTER — Ambulatory Visit (INDEPENDENT_AMBULATORY_CARE_PROVIDER_SITE_OTHER): Payer: No Typology Code available for payment source | Admitting: Diagnostic Neuroimaging

## 2021-01-12 VITALS — BP 121/84 | HR 102 | Ht 68.0 in | Wt 151.0 lb

## 2021-01-12 DIAGNOSIS — G35 Multiple sclerosis: Secondary | ICD-10-CM | POA: Diagnosis not present

## 2021-01-12 DIAGNOSIS — M25571 Pain in right ankle and joints of right foot: Secondary | ICD-10-CM

## 2021-01-12 DIAGNOSIS — G8929 Other chronic pain: Secondary | ICD-10-CM

## 2021-01-12 MED ORDER — AMANTADINE HCL 100 MG PO CAPS: 100.0000 mg | ORAL_CAPSULE | Freq: Two times a day (BID) | ORAL | 3 refills | Status: DC

## 2021-01-12 NOTE — Progress Notes (Signed)
[facility]  PATIENT: Paige Davis DOB: 1990-06-21  REFERRING CLINICIAN: [facility] HISTORY FROM: patient  REASON FOR VISIT: follow up    HISTORICAL  CHIEF COMPLAINT:  Chief Complaint  Patient presents with   Follow-up    RM 6 alone  Pt is still having brain fog, and vision has worsen since last visit, and having gladder issues.     HISTORY OF PRESENT ILLNESS:   UPDATE (01/12/21, VRP): Since last visit, doing poorly; intermittent crossed vision with heat; more anxiety, diff focusing. Modafinil not helping. OCT testing showed some decreased in RNFL (88um). She is concerned about her prognosis. Tolerating ocrevus. Bladder still affected.   UPDATE (07/07/20, VRP): Since last visit, doing much better since starting Ocrevus and getting Solu-Medrol.  Still has some fatigue, brain fog, twitching sensations.  Sometimes when she moves her eyes rapidly she feels slight blurred or crossed vision.  She has stopped smoking.  She is on Wellbutrin doing well.   UPDATE (03/10/20, VRP): Since last visit, now diagnosed with multiple sclerosis.  MRI and lab testing reviewed.  Planning to initiate Ocrevus.  Patient continues to have issues with fatigue, gait difficulty, incomplete bladder emptying.   PRIOR HPI (02/16/20): 30 year old female here for evaluation of lower extremity weakness.  March 2021 patient was having some difficulty initiating urination, diagnosed with UTIs and treated. She was also having generalized fatigue, muscle weakness in legs, aching sensation in legs. Patient got married in April 2021 and symptoms continued. She was having continuing weakness in the legs and gait difficulty. She was having intermittent numbness in fingers and toes.  Patient went to chiropractor and had some treatments. Patient referred here for further neurologic testing and evaluation.   REVIEW OF SYSTEMS: Full 14 system review of systems performed and negative with  exception of: As per HPI.  ALLERGIES: No Known Allergies  HOME MEDICATIONS: Outpatient Medications Prior to Visit  Medication Sig Dispense Refill   ALPRAZolam (XANAX) 0.5 MG tablet Take 0.5 mg by mouth as needed for anxiety.     baclofen (LIORESAL) 10 MG tablet TAKE 1/2-1 TABLET BY MOUTH 2 (TWO) TIMES DAILY AS NEEDED FOR MUSCLE SPASMS. 60 tablet 6   buPROPion (WELLBUTRIN SR) 150 MG 12 hr tablet Take 150 mg by mouth 2 (two) times daily.     Multiple Vitamin (MULTIVITAMIN) tablet Take 1 tablet by mouth daily.     Probiotic Product (PROBIOTIC DAILY PO) Take by mouth.     tamsulosin (FLOMAX) 0.4 MG CAPS capsule Take 0.4 mg by mouth daily.     Ibuprofen-diphenhydrAMINE Cit (ADVIL PM PO) Take by mouth.     modafinil (PROVIGIL) 100 MG tablet Take 1 tablet (100 mg total) by mouth daily. 30 tablet 5   No facility-administered medications prior to visit.    PAST MEDICAL HISTORY: Past Medical History:  Diagnosis Date   Anxiety    Asthma    Bladder spasms 07/2019   Multiple sclerosis (HCC)    Scoliosis     PAST SURGICAL HISTORY: Past Surgical History:  Procedure Laterality Date   BREAST ENHANCEMENT SURGERY  2012    FAMILY HISTORY: Family History  Problem Relation Age of Onset   Arthritis Mother    Multiple sclerosis Maternal Uncle     SOCIAL HISTORY: Social History   Socioeconomic History   Marital status: Married    Spouse name: Arlys John   Number of children: 0   Years of education: Not on file   Highest education level: GED  or equivalent  Occupational History    Comment: real estate agent  Tobacco Use   Smoking status: Every Day    Years: 1.00    Types: Cigarettes   Smokeless tobacco: Never  Substance and Sexual Activity   Alcohol use: Yes    Comment: occas   Drug use: Yes    Comment: 02/16/20 THC daily   Sexual activity: Not on file  Other Topics Concern   Not on file  Social History Narrative   Lives with husband, step children   Caffeine- not daily    Social Determinants of Health   Financial Resource Strain: Not on file  Food Insecurity: Not on file  Transportation Needs: Not on file  Physical Activity: Not on file  Stress: Not on file  Social Connections: Not on file  Intimate Partner Violence: Not on file     PHYSICAL EXAM  GENERAL EXAM/CONSTITUTIONAL: Vitals:  Vitals:   01/12/21 1424  BP: 121/84  Pulse: (!) 102  Weight: 151 lb (68.5 kg)  Height: 5\' 8"  (1.727 m)   Body mass index is 22.96 kg/m. Wt Readings from Last 3 Encounters:  01/12/21 151 lb (68.5 kg)  07/07/20 153 lb (69.4 kg)  02/16/20 144 lb 3.2 oz (65.4 kg)   Patient is in no distress; well developed, nourished and groomed; neck is supple  CARDIOVASCULAR: Examination of carotid arteries is normal; no carotid bruits Regular rate and rhythm, no murmurs Examination of peripheral vascular system by observation and palpation is normal  EYES: Ophthalmoscopic exam of optic discs and posterior segments is normal; no papilledema or hemorrhages No results found.   MUSCULOSKELETAL: Gait, strength, tone, movements noted in Neurologic exam below  NEUROLOGIC: MENTAL STATUS:  No flowsheet data found. awake, alert, oriented to person, place and time recent and remote memory intact normal attention and concentration language fluent, comprehension intact, naming intact fund of knowledge appropriate  CRANIAL NERVE:  2nd - no papilledema on fundoscopic exam 2nd, 3rd, 4th, 6th - pupils equal and reactive to light, visual fields full to confrontation, extraocular muscles intact, no nystagmus; SACCADIC DYSMETRIA 5th - facial sensation symmetric 7th - facial strength symmetric 8th - hearing intact 9th - palate elevates symmetrically, uvula midline 11th - shoulder shrug symmetric 12th - tongue protrusion midline  MOTOR:  normal bulk and tone, full strength in the BUE, BLE  SENSORY:  normal and symmetric to light touch, temperature,  vibration  COORDINATION:  finger-nose-finger, fine finger movements normal  REFLEXES:  deep tendon reflexes BRISK and symmetric  GAIT/STATION:  narrow based gait     DIAGNOSTIC DATA (LABS, IMAGING, TESTING) - I reviewed patient records, labs, notes, testing and imaging myself where available.  Lab Results  Component Value Date   WBC 6.6 06/08/2020   HGB 14.7 06/08/2020   HCT 43.3 06/08/2020   MCV 93 06/08/2020   PLT 280 06/08/2020      Component Value Date/Time   NA 141 06/08/2020 1039   K 4.2 06/08/2020 1039   CL 104 06/08/2020 1039   CO2 24 06/08/2020 1039   GLUCOSE 74 06/08/2020 1039   GLUCOSE 87 01/27/2011 0410   BUN 9 06/08/2020 1039   CREATININE 0.68 06/08/2020 1039   CALCIUM 9.1 06/08/2020 1039   PROT 6.4 06/08/2020 1039   ALBUMIN 4.5 06/08/2020 1039   AST 12 06/08/2020 1039   ALT 12 06/08/2020 1039   ALKPHOS 50 06/08/2020 1039   BILITOT 0.4 06/08/2020 1039   GFRNONAA 119 06/08/2020 1039   GFRAA 137  06/08/2020 1039   No results found for: CHOL, HDL, LDLCALC, LDLDIRECT, TRIG, CHOLHDL No results found for: QZRA0T Lab Results  Component Value Date   VITAMINB12 379 02/16/2020   Lab Results  Component Value Date   TSH 2.590 02/16/2020   Vit D, 25-Hydroxy  Date Value Ref Range Status  03/01/2020 31.8 30.0 - 100.0 ng/mL Final    Comment:    Vitamin D deficiency has been defined by the Institute of Medicine and an Endocrine Society practice guideline as a level of serum 25-OH vitamin D less than 20 ng/mL (1,2). The Endocrine Society went on to further define vitamin D insufficiency as a level between 21 and 29 ng/mL (2). 1. IOM (Institute of Medicine). 2010. Dietary reference    intakes for calcium and D. [place]: The    [employer]. 2. Holick MF, Binkley Romeo, Bischoff-Ferrari HA, et al.    Evaluation, treatment, and prevention of vitamin D    deficiency: an Endocrine Society clinical practice    guideline. JCEM. 2011 Jul;  96(7):1911-30.       01/27/20 CBC, CMP - nl a1c 4.6  02/23/20 ACE, ANA, SSA, SSB, HIV, RPR ANCA negative  02/23/20  Hep C Virus Ab 0.0 - 0.9 s/co ratio 0.1     02/23/20      Hep B Core Total Ab Negative Negative    Hepatitis B Surface Ag Negative Negative    Hep B Surface Ab, Qual Reactive     02/24/20 MRI brain (with and without) [I reviewed images myself and agree with interpretation. -VRP]  -Multiple periventricular, subcortical and pericallosal T2 hyperintensities, suspicious for chronic demyelinating disease. -Single left frontal subcortical enhancing lesion measuring 2 mm.  May represent acute demyelinating plaque.  02/18/20 MRI cervical spine (with and without) demonstrating: -T2 hyperintensities in the spinal cord at C3-4 and C5 levels, and within the left middle cerebellar peduncle intracranially.  No abnormal enhancement.  Findings can be seen with autoimmune, inflammatory, demyelinating, postinfectious or vascular etiologies.  02/24/20 MRI thoracic  - Normal MRI thoracic spine (with and without).   02/18/20 MRI lumbar spine  - Unremarkable MRI lumbar spine with and without contrast. No spinal stenosis or foraminal narrowing.      ASSESSMENT AND PLAN  30 y.o. year old female here with lower extremity weakness, difficulty with urination, numbness in hands and feet since 2021.  Dx:  1. Relapsing remitting multiple sclerosis (HCC)   2. Chronic pain of right ankle       PLAN:  RELASPING REMITTING MULTIPLE SCLEROSIS (LOWER EXTREMITY WEAKNESS / BLADDER SPASM / GAIT DIFFICULTY / HYPERREFLEXIA) - continue ocrevus; labs stable - continue multi-vitamin; vitamin D stable - check MRI brain, cervical spine (monitor for progression) - follow up 2nd opinion at [facility]  BRAIN FOG / ANXIETY / DEPRESSION - follow up with psychiatry / psychology - trial of amantadine 100mg  twice a day  BLADDER - continue tamsulosin (incomplete bladder emptying)  RIGHT FOOT /  ANKLE PAIN (prior injuries) - refer to sports medicine - continue physical exercises and strength training  Orders Placed This Encounter  Procedures   MR BRAIN W WO CONTRAST   MR CERVICAL SPINE W WO CONTRAST   AMB referral to sports medicine   Meds ordered this encounter  Medications   amantadine (SYMMETREL) 100 MG capsule    Sig: Take 1 capsule (100 mg total) by mouth 2 (two) times daily.    Dispense:  60 capsule    Refill:  3  Return in about 6 months (around 07/12/2021).    Suanne Marker, MD 01/12/2021, 3:41 PM Certified in Neurology, Neurophysiology and Neuroimaging  [facility] [address] [place], [place] [address] [phone]

## 2021-01-12 NOTE — Patient Instructions (Addendum)
RELASPING REMITTING MULTIPLE SCLEROSIS (LOWER EXTREMITY WEAKNESS / BLADDER SPASM / GAIT DIFFICULTY / HYPERREFLEXIA) - continue ocrevus - check MRI brain, cervical spine (monitor for progression) - follow up 2nd opinion at [facility]  BRAIN FOG / ANXIETY / DEPRESSION - follow up with psychiatry / psychology - trial of amantadine 100mg  twice a day  BLADDER - continue tamsulosin (incomplete bladder emptying)  RIGHT FOOT / ANKLE PAIN (prior injuries) - refer to sports medicine - continue physical exercises and strength training

## 2021-01-14 ENCOUNTER — Other Ambulatory Visit: Payer: Self-pay

## 2021-01-14 ENCOUNTER — Ambulatory Visit (INDEPENDENT_AMBULATORY_CARE_PROVIDER_SITE_OTHER): Payer: No Typology Code available for payment source | Admitting: Family Medicine

## 2021-01-14 VITALS — Ht 68.0 in | Wt 150.0 lb

## 2021-01-14 DIAGNOSIS — M25571 Pain in right ankle and joints of right foot: Secondary | ICD-10-CM

## 2021-01-14 DIAGNOSIS — M659 Synovitis and tenosynovitis, unspecified: Secondary | ICD-10-CM | POA: Diagnosis not present

## 2021-01-14 DIAGNOSIS — G8929 Other chronic pain: Secondary | ICD-10-CM | POA: Diagnosis not present

## 2021-01-14 NOTE — Patient Instructions (Addendum)
Thank you for coming to see me today. It was a pleasure. Today we talked about:   Your ankle pain is likely related to instability from your prior injury as well as some distress over your peroneal tendons.  You also have an irregularity on your talar dome likely from your accident that is similar to early arthritis.  Giving you insoles should help with this as well as sending you to physical therapy.  Please follow-up with [place] in 6 to 8 weeks to ensure that you are having improvement.  If you have any questions or concerns, please do not hesitate to call the office at [phone].  Best,   Luis Abed, DO [facility]

## 2021-01-14 NOTE — Progress Notes (Signed)
[facility] Building: Attending Note: I have reviewed the chart, discussed wit the Sports Medicine Fellow. I agree with assessment and treatment plan as detailed in the Fellow's note. I think she is never fully rehabilitated the prior ankle injury.  Formal physical therapy is probably the best route for her to take.

## 2021-01-14 NOTE — Assessment & Plan Note (Signed)
Her pain is most likely secondary to chronic ankle instability which also leads to stress on her peroneal tendons causing occasional tenosynovitis.  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  On ultrasound there is evidence of prior injury to her lateral talar dome, which could also contribute.  Discussed strengthening of dynamic stabilizers with physical therapy as well as physical therapy for peroneal tendons.  Also place in a sports insoles size 9-10 with bilateral small scaphoid pads to hopefully take pressure off of this and get her foot in better alignment.  We will have her follow-up in 6 to 8 weeks to ensure that there is improvement.

## 2021-01-14 NOTE — Progress Notes (Signed)
   Paige Davis is a 30 y.o. female who presents to [facility] today for the following:  Chronic right ankle pain Prior history of multiple sclerosis follows with neurology Currently on Ocrevus She has a prior history of numbness in her bilateral feet with MS relapse Prior foot drop as well that has improved She reports a prior skateboarding injury 15 years ago in which she had significant swelling of her ankle after an inversion injury but did not seek medical care She states that she has not had any significant injuries since then that she is aware of, but occasionally feels that her ankle will "give out" and invert She has some occasional lateral ankle pain and states that she also has occasional off-and-on swelling in her sinus tarsi  PMH reviewed.  ROS as above. Medications reviewed.  Exam:  Ht 5\' 8"  (1.727 m)   Wt 150 lb (68 kg)   BMI 22.81 kg/m  Gen: Well NAD MSK:  Right Ankle: - Inspection: She has some slight swelling of the right sinus tarsi in comparison to the left, otherwise no obvious deformity, erythema, or ecchymosis, ulcers, calluses, blisters b/l - Palpation: No TTP at MT heads, no TTP at base of 5th MT, no TTP over cuboid, no tenderness over navicular prominence, no TTP over lateral or medial malleolus.  No sign of peroneal tendon subluxation, she does have some mild tenderness to palpation in this region - Strength: Normal strength with dorsiflexion, plantarflexion, inversion, and eversion of foot; flexion and extension of toes b/l - ROM: Full ROM b/l - Neuro/vasc: NV intact distally bilaterally - Special Tests: Negative anterior drawer, positive inversion test.  Negative syndesmotic compression.  Feet: Normal arches at rest, but with weightbearing she does have some slight pes planus bilaterally.  Limited Right ankle:  - Peroneus longus and brevis: Evaluated in longitudinal transverse planes. Intact with small hypoechoic collection surrounding, suggesting  tenosynovitis. - Anterior joint: No evidence of ankle effusion.  Lateral edge of talar dome with slight hyperechoic irregularity likely related to old injury - Sinus tarsi: some scar tissue noted in this region     No results found.   Assessment and Plan: 1) Chronic pain of right ankle Her pain is most likely secondary to chronic ankle instability which also leads to stress on her peroneal tendons causing occasional tenosynovitis.  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  On ultrasound there is evidence of prior injury to her lateral talar dome, which could also contribute.  Discussed strengthening of dynamic stabilizers with physical therapy as well as physical therapy for peroneal tendons.  Also place in a sports insoles size 9-10 with bilateral small scaphoid pads to hopefully take pressure off of this and get her foot in better alignment.  We will have her follow-up in 6 to 8 weeks to ensure that there is improvement.   [place], D.O.  PGY-4 [facility]  01/14/2021 10:26 AM

## 2021-01-20 ENCOUNTER — Ambulatory Visit (HOSPITAL_BASED_OUTPATIENT_CLINIC_OR_DEPARTMENT_OTHER): Payer: BC Managed Care – PPO | Attending: Diagnostic Neuroimaging | Admitting: Physical Therapy

## 2021-01-20 ENCOUNTER — Other Ambulatory Visit: Payer: Self-pay

## 2021-01-20 ENCOUNTER — Telehealth: Payer: Self-pay | Admitting: Diagnostic Neuroimaging

## 2021-01-20 ENCOUNTER — Encounter (HOSPITAL_BASED_OUTPATIENT_CLINIC_OR_DEPARTMENT_OTHER): Payer: Self-pay | Admitting: Physical Therapy

## 2021-01-20 DIAGNOSIS — R2681 Unsteadiness on feet: Secondary | ICD-10-CM | POA: Insufficient documentation

## 2021-01-20 DIAGNOSIS — R29898 Other symptoms and signs involving the musculoskeletal system: Secondary | ICD-10-CM | POA: Insufficient documentation

## 2021-01-20 DIAGNOSIS — M6281 Muscle weakness (generalized): Secondary | ICD-10-CM | POA: Diagnosis present

## 2021-01-20 DIAGNOSIS — R2689 Other abnormalities of gait and mobility: Secondary | ICD-10-CM | POA: Diagnosis not present

## 2021-01-20 NOTE — Patient Instructions (Signed)
ccess Code: [email or web address] URL: [email or web address] Date: 01/20/2021 Prepared by: Lorayne Bender  Exercises Seated Ankle Inversion with Resistance - 3 x daily - 7 x weekly - 10 reps - 3 sets Ankle Plantar Flexion with Resistance - 3 x daily - 7 x weekly - 10 reps - 3 sets Seated Ankle Eversion with Resistance - 3 x daily - 7 x weekly - 10 reps - 3 sets Long Sitting Ankle Dorsiflexion with Anchored Resistance - 1 x daily - 7 x weekly - 10 reps - 3 sets Long Sitting Calf Stretch with Strap - 3 x daily - 7 x weekly - 3 reps - 1 sets - 20 hold Standing Single Leg Stance with Counter Support - 1 x daily - 7 x weekly - 3 sets - 3 reps - 20 hold The Diver - 1 x daily - 7 x weekly - 3 sets - 10 reps

## 2021-01-20 NOTE — Telephone Encounter (Signed)
spoke to the patient she is going to call me back to schedule  Paige Davis: [phone] (exp. 01/20/21 to 03/20/21)

## 2021-01-20 NOTE — Therapy (Addendum)
OUTPATIENT PHYSICAL THERAPY LOWER EXTREMITY EVALUATION/Discharge    Patient Name: Paige Davis MRN: [id number] DOB:06/19/1990, 30 y.o., female Today's Date: 01/21/2021  PCP: [facility] PROVIDER: Penni Bombard, MD   PT End of Session - 01/20/21 2027     Visit Number 1    Number of Visits 4    Date for PT Re-Evaluation 02/17/21    Authorization Type unknown    PT Start Time 1145    PT Stop Time 1228    PT Time Calculation (min) 43 min             Past Medical History:  Diagnosis Date   Anxiety    Asthma    Bladder spasms 07/2019   Multiple sclerosis ([place])    Scoliosis    Past Surgical History:  Procedure Laterality Date   BREAST ENHANCEMENT SURGERY  2012   Patient Active Problem List   Diagnosis Date Noted   Chronic pain of right ankle 01/14/2021   Right ovarian cyst 01/27/2011    ONSET DATE: 15 years piror but recent exacerbation  REFERRING DIAG:   THERAPY DIAG: Right Ankle Pain    SUBJECTIVE:   SUBJECTIVE:  About 15 years ago the patient the patient rolled her ankle. Since that poitn, about 3x a year she has increased pain in her ankle.                                                                                                                                                                                                               PERTINENT HISTORY:  scoliosis, MS, Anxiety   PAIN:  Are you having pain? Yes VAS scale: 1/10 Pain location: Ankle  Pain orientation: Right  PAIN TYPE: aching and sharp Pain description: intermittent  Aggravating factors: Standing and walking  Relieving factors: rest;   PRECAUTIONS: None  WEIGHT BEARING RESTRICTIONS No  FALLS: Has patient fallen in last 6 months? No, Number of falls:   LIVING ENVIRONMENT: Lives with: lives with their family Lives in: House/apartment Stairs: Yes; Internal: 12 steps; Rail on   going up and External: 4 steps; Rail on   going up Has following  equipment at home: None  PLOF: Independent Hiking, Would like to walk in heels;    PATIENT GOALS  to have less flair ups of her ankle    OBJECTIVE:   DIAGNOSTIC FINDINGS:    COGNITION:  Overall cognitive status: Within functional limits for tasks assessed     SENSATION:  Reports some tingling at times in  her leg     LE AROM/PROM:  AROM Right 01/21/2021 Left 01/21/2021  Hip flexion    Hip abduction    Hip adduction    Hip internal rotation    Hip external rotation    Knee flexion    Knee extension    Ankle dorsiflexion -3 5  Ankle plantarflexion    Ankle inversion Excessive and hypermobile  WNL  Ankle eversion  painful                        PROM Right 01/20/2021 Left 01/20/2021  Hip flexion    Hip abduction    Hip adduction    Hip internal rotation    Hip external rotation    Knee flexion    Knee extension    Ankle dorsiflexion 0 8  Ankle plantarflexion    Ankle inversion Painful and hypermobile    Ankle eversion      (Blank rows = not tested)     LE MMT:  MMT Right 01/21/2021 Left 01/21/2021  Hip flexion    Hip abduction    Hip adduction    Hip internal rotation    Hip external rotation    Knee flexion    Knee extension    Ankle dorsiflexion 4/5  5/5  Ankle plantarflexion Not tested 2nd to pain    Ankle inversion 4/5 5/5  Ankle eversion 4/5 5/5   (Blank rows = not tested)     GAIT: Comments: decreased single leg stance on the right;      Today's Treatment:  Access Code: [email or web address] URL: [email or web address] Date: 01/21/2021 Prepared by: Carolyne Littles  Exercises Seated Ankle Inversion with Resistance - 3 x daily - 7 x weekly - 10 reps - 3 sets Ankle Plantar Flexion with Resistance - 3 x daily - 7 x weekly - 10 reps - 3 sets Seated Ankle Eversion with Resistance - 3 x daily - 7 x weekly - 10 reps - 3 sets Long Sitting Ankle Dorsiflexion with Anchored Resistance - 1 x daily - 7 x weekly - 10 reps - 3 sets Long  Sitting Calf Stretch with Strap - 3 x daily - 7 x weekly - 3 reps - 1 sets - 20 hold Standing Single Leg Stance with Counter Support - 1 x daily - 7 x weekly - 3 sets - 3 reps - 20 hold The Diver - 1 x daily - 7 x weekly - 3 sets - 10 reps    PATIENT EDUCATION:  Education details: reviewed HEP; importance of stabilization  Person educated: Patient Education method: Explanation demonstration verbal and tactile cuing  Education comprehension: verbalized understanding   HOME EXERCISE PROGRAM:   ASSESSMENT:  CLINICAL IMPRESSION:   Patient is a 30 year old female with re-current episodes of R ankle pain and instability. She reports it happens 3-4 times a year for the past 15 years. She also had a recent diagnosis of MS which has effected her right leg. She has excessive inversion of the right ankle. When she has pain it I around the lateral  malleolus, sub taylor are and into her peroneals. She currently does not have any pain. She wears an ASO when she is walking a lot which helps. She would like to take her exercises and work on them on her own. Therapy reviewed single leg progression and progression of focused ankle exercises. Her case will be left open in case she wants a follow up but she will  likely just be seen 1x.     1-2 comorbidities: MS, scoliosis, anxiety   cleaning, driving, meal prep, and occupation  Abnormal gait, decreased activity tolerance, decreased endurance, decreased mobility, difficulty walking, decreased ROM, decreased strength, and pain  REHAB POTENTIAL: Good  CLINICAL DECISION MAKING: Stable/uncomplicated  EVALUATION COMPLEXITY: Low   GOALS: Goals reviewed with patient? Yes  SHORT TERM GOALS:  STG Name Target Date Goal status  1 Patient will be Independent with HEP   Baseline:  02/04/2021 MET   PLAN: PT FREQUENCY: one time visit    PLANNED INTERVENTIONS: Therapeutic exercises, Therapeutic activity, Neuro Muscular re-education, Balance training,  Gait training, Patient/Family education, Joint mobilization, Stair training, and Aquatic Therapy  PLAN FOR NEXT SESSION: Patient would like to try HEP on her own. We will keep her case open if she decides to do an actual episode of rehab     Carney Living PT DPT  01/21/2021, 1:09 PM

## 2021-01-25 NOTE — Telephone Encounter (Signed)
Patient is scheduled at [facility] for 02/01/21.

## 2021-02-01 ENCOUNTER — Ambulatory Visit (INDEPENDENT_AMBULATORY_CARE_PROVIDER_SITE_OTHER): Payer: BC Managed Care – PPO

## 2021-02-01 DIAGNOSIS — G35 Multiple sclerosis: Secondary | ICD-10-CM | POA: Diagnosis not present

## 2021-02-01 MED ORDER — GADOBENATE DIMEGLUMINE 529 MG/ML IV SOLN
15.0000 mL | Freq: Once | INTRAVENOUS | Status: AC | PRN
  Administered 2021-02-01: 15 mL via INTRAVENOUS

## 2021-04-04 ENCOUNTER — Telehealth: Payer: Self-pay

## 2021-04-04 NOTE — Telephone Encounter (Signed)
Update Ocrevus order forms have been provided to [facility] for processing.

## 2021-04-30 ENCOUNTER — Encounter: Payer: Self-pay | Admitting: Diagnostic Neuroimaging

## 2021-05-05 ENCOUNTER — Encounter (HOSPITAL_BASED_OUTPATIENT_CLINIC_OR_DEPARTMENT_OTHER): Payer: Self-pay | Admitting: Physical Therapy

## 2021-05-07 ENCOUNTER — Other Ambulatory Visit: Payer: Self-pay | Admitting: Diagnostic Neuroimaging

## 2021-07-12 ENCOUNTER — Telehealth: Payer: Self-pay

## 2021-07-12 NOTE — Telephone Encounter (Signed)
Received fax from [facility] [employer] stating the pt has been approved for ocrevus patient assistance for the 2023 year.  ? ?Billing Information  ?[id number] ?[id number] ?[id number] ?Group- [id number] ?Other-Payer ID [id number] ? ?Help desk # [phone].  ?

## 2021-07-13 ENCOUNTER — Ambulatory Visit: Payer: BC Managed Care – PPO | Admitting: Diagnostic Neuroimaging

## 2021-07-13 ENCOUNTER — Encounter: Payer: Self-pay | Admitting: Diagnostic Neuroimaging

## 2021-07-13 VITALS — BP 117/74 | HR 68 | Ht 68.0 in | Wt 151.0 lb

## 2021-07-13 DIAGNOSIS — Z5181 Encounter for therapeutic drug level monitoring: Secondary | ICD-10-CM | POA: Diagnosis not present

## 2021-07-13 DIAGNOSIS — G35 Multiple sclerosis: Secondary | ICD-10-CM

## 2021-07-13 MED ORDER — MODAFINIL 100 MG PO TABS: 200.0000 mg | ORAL_TABLET | Freq: Every day | ORAL | 5 refills | Status: DC

## 2021-07-13 NOTE — Progress Notes (Signed)
[facility]  PATIENT: Paige Davis DOB: [birth year]  REFERRING CLINICIAN: [facility] HISTORY FROM: patient  REASON FOR VISIT: follow up    HISTORICAL  CHIEF COMPLAINT:  Chief Complaint  Patient presents with   Multiple Sclerosis    Rm 6, 6 month FU, next Ocrevus infusion 07/20/21, "I feel tired a lot, may want to try modafinil again"    HISTORY OF PRESENT ILLNESS:   UPDATE (07/13/21, VRP): Since last visit, had 2nd opinion at [facility] Plan recs to stay the same. Still with more fatigue. Sleeping 7-8 hours at night. Spasms controlled with baclofen. Tolerating ocrevus.   UPDATE (01/12/21, VRP): Since last visit, doing poorly; intermittent crossed vision with heat; more anxiety, diff focusing. Modafinil not helping. OCT testing showed some decreased in RNFL (88um). She is concerned about her prognosis. Tolerating ocrevus. Bladder still affected.   UPDATE (07/07/20, VRP): Since last visit, doing much better since starting Ocrevus and getting Solu-Medrol.  Still has some fatigue, brain fog, twitching sensations.  Sometimes when she moves her eyes rapidly she feels slight blurred or crossed vision.  She has stopped smoking.  She is on Wellbutrin doing well.   UPDATE (03/10/20, VRP): Since last visit, now diagnosed with multiple sclerosis.  MRI and lab testing reviewed.  Planning to initiate Ocrevus.  Patient continues to have issues with fatigue, gait difficulty, incomplete bladder emptying.   PRIOR HPI (02/16/20): 31 year old female here for evaluation of lower extremity weakness.  March 2021 patient was having some difficulty initiating urination, diagnosed with UTIs and treated. She was also having generalized fatigue, muscle weakness in legs, aching sensation in legs. Patient got married in April 2021 and symptoms continued. She was having continuing weakness in the legs and gait difficulty. She was having intermittent numbness in fingers and toes.  Patient  went to chiropractor and had some treatments. Patient referred here for further neurologic testing and evaluation.   REVIEW OF SYSTEMS: Full 14 system review of systems performed and negative with exception of: As per HPI.  ALLERGIES: No Known Allergies  HOME MEDICATIONS: Outpatient Medications Prior to Visit  Medication Sig Dispense Refill   ALPRAZolam (XANAX) 0.5 MG tablet Take 0.5 mg by mouth as needed for anxiety.     amantadine (SYMMETREL) 100 MG capsule TAKE 1 CAPSULE BY MOUTH TWICE A DAY 180 capsule 1   baclofen (LIORESAL) 10 MG tablet TAKE 1/2-1 TABLET BY MOUTH 2 (TWO) TIMES DAILY AS NEEDED FOR MUSCLE SPASMS. 60 tablet 6   buPROPion (WELLBUTRIN SR) 150 MG 12 hr tablet Take 150 mg by mouth 2 (two) times daily.     ocrelizumab 600 mg in sodium chloride 0.9 % 500 mL Inject 600 mg into the vein every 6 (six) months.     tamsulosin (FLOMAX) 0.4 MG CAPS capsule Take 0.4 mg by mouth daily.     Multiple Vitamin (MULTIVITAMIN) tablet Take 1 tablet by mouth daily.     Probiotic Product (PROBIOTIC DAILY PO) Take by mouth.     No facility-administered medications prior to visit.    PAST MEDICAL HISTORY: Past Medical History:  Diagnosis Date   Anxiety    Asthma    Bladder spasms 07/2019   Multiple sclerosis (HCC)    Scoliosis     PAST SURGICAL HISTORY: Past Surgical History:  Procedure Laterality Date   BREAST ENHANCEMENT SURGERY  2012    FAMILY HISTORY: Family History  Problem Relation Age of Onset   Arthritis Mother    Multiple sclerosis Maternal  Uncle     SOCIAL HISTORY: Social History   Socioeconomic History   Marital status: Married    Spouse name: Arlys John   Number of children: 0   Years of education: Not on file   Highest education level: GED or equivalent  Occupational History    Comment: real estate agent  Tobacco Use   Smoking status: Every Day    Years: 1.00    Types: Cigarettes   Smokeless tobacco: Never  Substance and Sexual Activity   Alcohol use:  Yes    Comment: occas   Drug use: Yes    Comment: 02/16/20 THC daily   Sexual activity: Not on file  Other Topics Concern   Not on file  Social History Narrative   Lives with husband, step children   Caffeine- not daily   Social Determinants of Health   Financial Resource Strain: Not on file  Food Insecurity: Not on file  Transportation Needs: Not on file  Physical Activity: Not on file  Stress: Not on file  Social Connections: Not on file  Intimate Partner Violence: Not on file     PHYSICAL EXAM  GENERAL EXAM/CONSTITUTIONAL: Vitals:  Vitals:   07/13/21 1427  BP: 117/74  Pulse: 68  Weight: 151 lb (68.5 kg)  Height: 5\' 8"  (1.727 m)    Body mass index is 22.96 kg/m. Wt Readings from Last 3 Encounters:  07/13/21 151 lb (68.5 kg)  01/14/21 150 lb (68 kg)  01/12/21 151 lb (68.5 kg)   Patient is in no distress; well developed, nourished and groomed; neck is supple  CARDIOVASCULAR: Examination of carotid arteries is normal; no carotid bruits Regular rate and rhythm, no murmurs Examination of peripheral vascular system by observation and palpation is normal  EYES: Ophthalmoscopic exam of optic discs and posterior segments is normal; no papilledema or hemorrhages No results found.   MUSCULOSKELETAL: Gait, strength, tone, movements noted in Neurologic exam below  NEUROLOGIC: MENTAL STATUS:  No flowsheet data found. awake, alert, oriented to person, place and time recent and remote memory intact normal attention and concentration language fluent, comprehension intact, naming intact fund of knowledge appropriate  CRANIAL NERVE:  2nd - no papilledema on fundoscopic exam 2nd, 3rd, 4th, 6th - pupils equal and reactive to light, visual fields full to confrontation, extraocular muscles intact, no nystagmus; SACCADIC DYSMETRIA 5th - facial sensation symmetric 7th - facial strength symmetric 8th - hearing intact 9th - palate elevates symmetrically, uvula  midline 11th - shoulder shrug symmetric 12th - tongue protrusion midline  MOTOR:  normal bulk and tone, full strength in the BUE, BLE  SENSORY:  normal and symmetric to light touch, temperature, vibration  COORDINATION:  finger-nose-finger, fine finger movements normal  REFLEXES:  deep tendon reflexes BRISK and symmetric  GAIT/STATION:  narrow based gait     DIAGNOSTIC DATA (LABS, IMAGING, TESTING) - I reviewed patient records, labs, notes, testing and imaging myself where available.  Lab Results  Component Value Date   WBC 6.6 06/08/2020   HGB 14.7 06/08/2020   HCT 43.3 06/08/2020   MCV 93 06/08/2020   PLT 280 06/08/2020      Component Value Date/Time   NA 141 06/08/2020 1039   K 4.2 06/08/2020 1039   CL 104 06/08/2020 1039   CO2 24 06/08/2020 1039   GLUCOSE 74 06/08/2020 1039   GLUCOSE 87 01/27/2011 0410   BUN 9 06/08/2020 1039   CREATININE 0.68 06/08/2020 1039   CALCIUM 9.1 06/08/2020 1039   PROT 6.4  06/08/2020 1039   ALBUMIN 4.5 06/08/2020 1039   AST 12 06/08/2020 1039   ALT 12 06/08/2020 1039   ALKPHOS 50 06/08/2020 1039   BILITOT 0.4 06/08/2020 1039   GFRNONAA 119 06/08/2020 1039   GFRAA 137 06/08/2020 1039   No results found for: CHOL, HDL, LDLCALC, LDLDIRECT, TRIG, CHOLHDL No results found for: GEFU0T Lab Results  Component Value Date   VITAMINB12 379 02/16/2020   Lab Results  Component Value Date   TSH 2.590 02/16/2020   Vit D, 25-Hydroxy  Date Value Ref Range Status  03/01/2020 31.8 30.0 - 100.0 ng/mL Final    Comment:    Vitamin D deficiency has been defined by the Institute of Medicine and an Endocrine Society practice guideline as a level of serum 25-OH vitamin D less than 20 ng/mL (1,2). The Endocrine Society went on to further define vitamin D insufficiency as a level between 21 and 29 ng/mL (2). 1. IOM (Institute of Medicine). 2010. Dietary reference    intakes for calcium and D. [place]: The    [employer]. 2. Holick MF, Binkley [place], Bischoff-Ferrari HA, et al.    Evaluation, treatment, and prevention of vitamin D    deficiency: an Endocrine Society clinical practice    guideline. JCEM. 2011 Jul; 96(7):1911-30.       01/27/20 CBC, CMP - nl a1c 4.6  02/23/20 ACE, ANA, SSA, SSB, HIV, RPR ANCA negative  02/23/20  Hep C Virus Ab 0.0 - 0.9 s/co ratio 0.1     02/23/20      Hep B Core Total Ab Negative Negative    Hepatitis B Surface Ag Negative Negative    Hep B Surface Ab, Qual Reactive     06/15/21  CBC, CMP --> normal  02/24/20 MRI brain (with and without) [I reviewed images myself and agree with interpretation. -VRP]  -Multiple periventricular, subcortical and pericallosal T2 hyperintensities, suspicious for chronic demyelinating disease. -Single left frontal subcortical enhancing lesion measuring 2 mm.  May represent acute demyelinating plaque.  02/18/20 MRI cervical spine (with and without) demonstrating: -T2 hyperintensities in the spinal cord at C3-4 and C5 levels, and within the left middle cerebellar peduncle intracranially.  No abnormal enhancement.  Findings can be seen with autoimmune, inflammatory, demyelinating, postinfectious or vascular etiologies.  02/24/20 MRI thoracic  - Normal MRI thoracic spine (with and without).   02/18/20 MRI lumbar spine  - Unremarkable MRI lumbar spine with and without contrast. No spinal stenosis or foraminal narrowing.   02/01/21  MRI brain with and without contrast demonstrating: -Multiple stable supratentorial and infratentorial chronic demyelinating plaques. -No acute plaques.  No new plaques seen.  02/01/21 MRI cervical spine with and without contrast demonstrating: -Faint chronic demyelinating plaques within the spinal cord at C3 and C5 levels on sagittal views.  These appear similar to slightly improved compared to MRI from 02/18/2020. -No acute plaques.  No new plaques seen.    ASSESSMENT AND PLAN  31 y.o. year old  female here with lower extremity weakness, difficulty with urination, numbness in hands and feet since 2021.  Dx:  1. Relapsing remitting multiple sclerosis (HCC)   2. Encounter for therapeutic drug monitoring      PLAN:  RELASPING REMITTING MULTIPLE SCLEROSIS (LOWER EXTREMITY WEAKNESS / BLADDER SPASM / GAIT DIFFICULTY / HYPERREFLEXIA) - continue ocrevus; labs stable - continue multi-vitamin; vitamin D stable - continue baclofen for spasm  BRAIN FOG / ANXIETY / DEPRESSION - follow up with psychiatry / psychology - stop  amantadine; start modafinil 100-200mg  daily - continue wellbutrin, xanax  BLADDER ISSUES - continue tamsulosin (incomplete bladder emptying) - consider urodynamic studies per urology (referred back in 2021)  RIGHT FOOT / ANKLE PAIN (prior injuries) - refer to sports medicine - continue physical exercises and strength training  Meds ordered this encounter  Medications   modafinil (PROVIGIL) 100 MG tablet    Sig: Take 2 tablets (200 mg total) by mouth daily.    Dispense:  60 tablet    Refill:  5   Return in about 1 year (around 07/14/2022).    Suanne Marker, MD 07/13/2021, 4:39 PM Certified in Neurology, Neurophysiology and Neuroimaging  [facility] [address], [address] [place], [place] [address] [phone]

## 2021-07-14 ENCOUNTER — Other Ambulatory Visit: Payer: Self-pay | Admitting: *Deleted

## 2021-07-14 ENCOUNTER — Telehealth: Payer: Self-pay | Admitting: *Deleted

## 2021-07-14 DIAGNOSIS — G35 Multiple sclerosis: Secondary | ICD-10-CM

## 2021-07-14 DIAGNOSIS — Z5181 Encounter for therapeutic drug level monitoring: Secondary | ICD-10-CM

## 2021-07-14 NOTE — Telephone Encounter (Signed)
Per Freddi Starr RN, patient must have IGG levels drawn before next Ocrevus scheduled for 07/20/21. I called patient and advised her the lab needs to be done. She will come Mon morning at 8 am for lab. Patient verbalized understanding, appreciation. ? ?

## 2021-07-18 ENCOUNTER — Other Ambulatory Visit (INDEPENDENT_AMBULATORY_CARE_PROVIDER_SITE_OTHER): Payer: Self-pay

## 2021-07-18 DIAGNOSIS — G35 Multiple sclerosis: Secondary | ICD-10-CM

## 2021-07-18 DIAGNOSIS — Z0289 Encounter for other administrative examinations: Secondary | ICD-10-CM

## 2021-07-18 DIAGNOSIS — Z5181 Encounter for therapeutic drug level monitoring: Secondary | ICD-10-CM

## 2021-07-19 ENCOUNTER — Encounter: Payer: Self-pay | Admitting: Diagnostic Neuroimaging

## 2021-07-19 LAB — IGG, IGA, IGM
IgA/Immunoglobulin A, Serum: 260 mg/dL (ref 87–352)
IgG (Immunoglobin G), Serum: 727 mg/dL (ref 586–1602)
IgM (Immunoglobulin M), Srm: 63 mg/dL (ref 26–217)

## 2021-07-19 MED ORDER — MODAFINIL 200 MG PO TABS: 200.0000 mg | ORAL_TABLET | Freq: Every day | ORAL | 2 refills | Status: DC

## 2021-07-20 NOTE — Telephone Encounter (Signed)
Per ok from Dr. Marjory Lies, ok to change modafinil from 100 mg bid to 200 mg 1 per day.  ? ?Rx has been sent and cancel order has been sent to [facility] via fax.  ?

## 2021-07-30 ENCOUNTER — Encounter: Payer: Self-pay | Admitting: Diagnostic Neuroimaging

## 2021-08-02 ENCOUNTER — Other Ambulatory Visit: Payer: Self-pay | Admitting: *Deleted

## 2021-08-02 ENCOUNTER — Telehealth: Payer: Self-pay | Admitting: *Deleted

## 2021-08-02 DIAGNOSIS — M6281 Muscle weakness (generalized): Secondary | ICD-10-CM

## 2021-08-02 DIAGNOSIS — G35A Relapsing-remitting multiple sclerosis: Secondary | ICD-10-CM

## 2021-08-02 DIAGNOSIS — G35 Multiple sclerosis: Secondary | ICD-10-CM

## 2021-08-02 MED ORDER — MODAFINIL 200 MG PO TABS: 200.0000 mg | ORAL_TABLET | Freq: Every day | ORAL | 2 refills | Status: DC

## 2021-08-02 NOTE — Telephone Encounter (Signed)
Received my chart stating she no longer has insurance. I filled out application for [facility] Patient Foundation assistance for Vici and notified her via my chart.  ?

## 2021-08-03 ENCOUNTER — Encounter: Payer: Self-pay | Admitting: *Deleted

## 2021-08-03 NOTE — Telephone Encounter (Signed)
Received e mail from [place] with [employer] who stated they did not receive [facility] Patient Paige Davis form. She will e mail form.  ?

## 2021-08-03 NOTE — Telephone Encounter (Signed)
Patient has filled out patient consent form and sent it back electronically. MD signed PAP and it has been faxed to [facility], received confirmation.  ?

## 2021-08-03 NOTE — Telephone Encounter (Signed)
Received form [employer], filled out and on MD desk for signature. Printed Patient Consent Form. Sent my chart advising her she needs to come in and fill it out.  ?

## 2021-08-08 ENCOUNTER — Encounter: Payer: Self-pay | Admitting: *Deleted

## 2021-08-08 NOTE — Telephone Encounter (Signed)
Received e mail from [place]: ? ?The [facility] Patient Paige Davis is currently unable to determine your patient's eligibility because the patient's insurance does not speak to 3rd parties. Please contact the patient's insurance provider ([place]) to determine the coverage breakdown. Once this is confirmed, please contact the [place] so we may evaluate the patient's eligibility. ?If you have any questions, please give [place] a call at [phone], Monday-Friday, 6am to 5pm PST. ? ?Thank you, ?[employer]? Patient Foundation ? ?I sent her my chart asking for clarification about whether she has insurance or not. ?

## 2021-08-09 NOTE — Telephone Encounter (Signed)
Patient did reply, stated she no longer has insurance. She called [employer] yesterday. I received fax from [place] patient foundation: patient has been approved for free Ocrevus Drug. ?I gave copy to [facility].  ?Service requst ID: [id number], patient ID: [id number].   ?

## 2021-09-28 ENCOUNTER — Encounter: Payer: Self-pay | Admitting: Diagnostic Neuroimaging

## 2021-10-05 ENCOUNTER — Other Ambulatory Visit: Payer: Self-pay | Admitting: *Deleted

## 2021-10-05 DIAGNOSIS — G35 Multiple sclerosis: Secondary | ICD-10-CM

## 2021-10-05 DIAGNOSIS — M6281 Muscle weakness (generalized): Secondary | ICD-10-CM

## 2021-10-05 MED ORDER — MODAFINIL 200 MG PO TABS: 200.0000 mg | ORAL_TABLET | Freq: Every day | ORAL | 5 refills | Status: DC

## 2021-10-05 NOTE — Progress Notes (Signed)
Meds ordered this encounter  Medications   modafinil (PROVIGIL) 200 MG tablet    Sig: Take 1 tablet (200 mg total) by mouth daily.    Dispense:  30 tablet    Refill:  5    Suanne Marker, MD 10/05/2021, 1:50 PM Certified in Neurology, Neurophysiology and Neuroimaging  [facility] [address] [place], [place] [address] [phone]

## 2021-10-10 ENCOUNTER — Other Ambulatory Visit: Payer: Self-pay | Admitting: *Deleted

## 2021-10-10 DIAGNOSIS — M6281 Muscle weakness (generalized): Secondary | ICD-10-CM

## 2021-10-10 DIAGNOSIS — G35 Multiple sclerosis: Secondary | ICD-10-CM

## 2021-10-10 MED ORDER — MODAFINIL 200 MG PO TABS: 200.0000 mg | ORAL_TABLET | Freq: Two times a day (BID) | ORAL | 2 refills | Status: DC

## 2021-11-02 ENCOUNTER — Telehealth: Payer: Self-pay | Admitting: *Deleted

## 2021-11-02 NOTE — Telephone Encounter (Signed)
Per patient [facility] hasn't received modafinil Rx. Called pharmacy, spoke with Marchelle Folks and gave her verbal for modafinil 200 mg twice a day. She asked for print Rx to be faxed again, stated they were having problems with their fax machine but ok now. Rx faxed, received confirmation. Advised patient via my chart.

## 2021-12-14 ENCOUNTER — Encounter: Payer: Self-pay | Admitting: Diagnostic Neuroimaging

## 2021-12-14 ENCOUNTER — Other Ambulatory Visit: Payer: Self-pay | Admitting: *Deleted

## 2021-12-14 DIAGNOSIS — Z5181 Encounter for therapeutic drug level monitoring: Secondary | ICD-10-CM

## 2021-12-14 DIAGNOSIS — G35 Multiple sclerosis: Secondary | ICD-10-CM

## 2021-12-28 ENCOUNTER — Other Ambulatory Visit (INDEPENDENT_AMBULATORY_CARE_PROVIDER_SITE_OTHER): Payer: BC Managed Care – PPO

## 2021-12-28 DIAGNOSIS — Z5181 Encounter for therapeutic drug level monitoring: Secondary | ICD-10-CM

## 2021-12-28 DIAGNOSIS — Z0289 Encounter for other administrative examinations: Secondary | ICD-10-CM

## 2021-12-28 DIAGNOSIS — G35 Multiple sclerosis: Secondary | ICD-10-CM

## 2021-12-29 LAB — CBC WITH DIFFERENTIAL/PLATELET
Basophils Absolute: 0 10*3/uL (ref 0.0–0.2)
Basos: 0 %
EOS (ABSOLUTE): 0.1 10*3/uL (ref 0.0–0.4)
Eos: 2 %
Hematocrit: 40.4 % (ref 34.0–46.6)
Hemoglobin: 13.9 g/dL (ref 11.1–15.9)
Immature Grans (Abs): 0 10*3/uL (ref 0.0–0.1)
Immature Granulocytes: 0 %
Lymphocytes Absolute: 1.4 10*3/uL (ref 0.7–3.1)
Lymphs: 21 %
MCH: 32.5 pg (ref 26.6–33.0)
MCHC: 34.4 g/dL (ref 31.5–35.7)
MCV: 94 fL (ref 79–97)
Monocytes Absolute: 0.3 10*3/uL (ref 0.1–0.9)
Monocytes: 5 %
Neutrophils Absolute: 5.1 10*3/uL (ref 1.4–7.0)
Neutrophils: 72 %
Platelets: 299 10*3/uL (ref 150–450)
RBC: 4.28 x10E6/uL (ref 3.77–5.28)
RDW: 12.4 % (ref 11.7–15.4)
WBC: 7 10*3/uL (ref 3.4–10.8)

## 2021-12-29 LAB — IGG, IGA, IGM
IgA/Immunoglobulin A, Serum: 263 mg/dL (ref 87–352)
IgG (Immunoglobin G), Serum: 690 mg/dL (ref 586–1602)
IgM (Immunoglobulin M), Srm: 51 mg/dL (ref 26–217)

## 2022-01-02 ENCOUNTER — Telehealth: Payer: Self-pay

## 2022-01-02 ENCOUNTER — Telehealth: Payer: Self-pay | Admitting: *Deleted

## 2022-01-02 NOTE — Telephone Encounter (Signed)
Pt returning phone call about results. Would like a call back.

## 2022-01-02 NOTE — Telephone Encounter (Signed)
Called pt and left a voicemail to call back and discuss results.

## 2022-01-02 NOTE — Telephone Encounter (Signed)
Received fax form [employer] Patient foundation: patient is approved to receive Ocrevus free of charge. Copy given to [facility], RN, infusion suite. Pt ID: [id number]

## 2022-01-02 NOTE — Telephone Encounter (Signed)
Called pt back and notified her of results. She appreciated and thanked for the call.

## 2022-01-03 ENCOUNTER — Encounter: Payer: Self-pay | Admitting: Diagnostic Neuroimaging

## 2022-01-04 ENCOUNTER — Other Ambulatory Visit: Payer: Self-pay | Admitting: *Deleted

## 2022-01-04 MED ORDER — BACLOFEN 10 MG PO TABS: ORAL_TABLET | ORAL | 6 refills | Status: DC

## 2022-03-28 ENCOUNTER — Encounter: Payer: Self-pay | Admitting: Diagnostic Neuroimaging

## 2022-03-29 ENCOUNTER — Other Ambulatory Visit: Payer: Self-pay | Admitting: Diagnostic Neuroimaging

## 2022-03-29 ENCOUNTER — Other Ambulatory Visit: Payer: Self-pay | Admitting: Neurology

## 2022-03-29 DIAGNOSIS — M6281 Muscle weakness (generalized): Secondary | ICD-10-CM

## 2022-03-29 DIAGNOSIS — G35 Multiple sclerosis: Secondary | ICD-10-CM

## 2022-03-29 MED ORDER — MODAFINIL 200 MG PO TABS: 200.0000 mg | ORAL_TABLET | Freq: Two times a day (BID) | ORAL | 0 refills | Status: DC

## 2022-05-14 ENCOUNTER — Other Ambulatory Visit: Payer: Self-pay | Admitting: Psychiatry

## 2022-05-14 DIAGNOSIS — G35 Multiple sclerosis: Secondary | ICD-10-CM

## 2022-05-14 DIAGNOSIS — M6281 Muscle weakness (generalized): Secondary | ICD-10-CM

## 2022-05-16 ENCOUNTER — Encounter: Payer: Self-pay | Admitting: Diagnostic Neuroimaging

## 2022-05-16 ENCOUNTER — Other Ambulatory Visit: Payer: Self-pay | Admitting: Psychiatry

## 2022-05-16 DIAGNOSIS — G35 Multiple sclerosis: Secondary | ICD-10-CM

## 2022-05-16 DIAGNOSIS — M6281 Muscle weakness (generalized): Secondary | ICD-10-CM

## 2022-05-16 MED ORDER — MODAFINIL 200 MG PO TABS: 200.0000 mg | ORAL_TABLET | Freq: Two times a day (BID) | ORAL | 0 refills | Status: DC

## 2022-05-16 NOTE — Telephone Encounter (Signed)
Please forward to Dr. Leta Baptist. This is his patient and I just sent a refill one time when I was covering for him, thanks

## 2022-05-30 ENCOUNTER — Other Ambulatory Visit: Payer: Self-pay | Admitting: Diagnostic Neuroimaging

## 2022-06-21 ENCOUNTER — Encounter: Payer: Self-pay | Admitting: Diagnostic Neuroimaging

## 2022-06-21 ENCOUNTER — Telehealth: Payer: 59 | Admitting: Diagnostic Neuroimaging

## 2022-06-21 DIAGNOSIS — Z5181 Encounter for therapeutic drug level monitoring: Secondary | ICD-10-CM | POA: Diagnosis not present

## 2022-06-21 DIAGNOSIS — M6281 Muscle weakness (generalized): Secondary | ICD-10-CM | POA: Diagnosis not present

## 2022-06-21 DIAGNOSIS — G35 Multiple sclerosis: Secondary | ICD-10-CM

## 2022-06-21 MED ORDER — MODAFINIL 200 MG PO TABS: 200.0000 mg | ORAL_TABLET | Freq: Two times a day (BID) | ORAL | 5 refills | Status: DC

## 2022-06-21 MED ORDER — BACLOFEN 10 MG PO TABS: 10.0000 mg | ORAL_TABLET | Freq: Two times a day (BID) | ORAL | 4 refills | Status: DC

## 2022-06-21 NOTE — Progress Notes (Signed)
[facility]  PATIENT: Paige Davis DOB: [birth year]  REFERRING CLINICIAN: [facility] HISTORY FROM: patient  REASON FOR VISIT: follow up    HISTORICAL  CHIEF COMPLAINT:  Chief Complaint  Patient presents with   Multiple Sclerosis    HISTORY OF PRESENT ILLNESS:   UPDATE (06/21/22, VRP): Since last visit, doing well. Mood, energy, brain fog are improved. No alleviating or aggravating factors. Tolerating meds.  UPDATE (07/13/21, VRP): Since last visit, had 2nd opinion at [facility]. Plan recs to stay the same. Still with more fatigue. Sleeping 7-8 hours at night. Spasms controlled with baclofen. Tolerating ocrevus.   UPDATE (01/12/21, VRP): Since last visit, doing poorly; intermittent crossed vision with heat; more anxiety, diff focusing. Modafinil not helping. OCT testing showed some decreased in RNFL (88um). She is concerned about her prognosis. Tolerating ocrevus. Bladder still affected.   UPDATE (07/07/20, VRP): Since last visit, doing much better since starting Ocrevus and getting Solu-Medrol.  Still has some fatigue, brain fog, twitching sensations.  Sometimes when she moves her eyes rapidly she feels slight blurred or crossed vision.  She has stopped smoking.  She is on Wellbutrin doing well.   UPDATE (03/10/20, VRP): Since last visit, now diagnosed with multiple sclerosis.  MRI and lab testing reviewed.  Planning to initiate Ocrevus.  Patient continues to have issues with fatigue, gait difficulty, incomplete bladder emptying.   PRIOR HPI (02/16/20): 32 year old female here for evaluation of lower extremity weakness.  March 2021 patient was having some difficulty initiating urination, diagnosed with UTIs and treated. She was also having generalized fatigue, muscle weakness in legs, aching sensation in legs. Patient got married in April 2021 and symptoms continued. She was having continuing weakness in the legs and gait difficulty. She was having intermittent  numbness in fingers and toes.  Patient went to chiropractor and had some treatments. Patient referred here for further neurologic testing and evaluation.   REVIEW OF SYSTEMS: Full 14 system review of systems performed and negative with exception of: As per HPI.  ALLERGIES: No Known Allergies  HOME MEDICATIONS: Outpatient Medications Prior to Visit  Medication Sig Dispense Refill   ALPRAZolam (XANAX) 0.5 MG tablet Take 0.5 mg by mouth as needed for anxiety.     baclofen (LIORESAL) 10 MG tablet TAKE 1/2-1 TABLET BY MOUTH 2 (TWO) TIMES DAILY AS NEEDED FOR MUSCLE SPASMS. 180 tablet 2   modafinil (PROVIGIL) 200 MG tablet Take 1 tablet (200 mg total) by mouth 2 (two) times daily. 60 tablet 0   tamsulosin (FLOMAX) 0.4 MG CAPS capsule Take 0.4 mg by mouth daily.     amantadine (SYMMETREL) 100 MG capsule TAKE 1 CAPSULE BY MOUTH TWICE A DAY 180 capsule 1   buPROPion (WELLBUTRIN SR) 150 MG 12 hr tablet Take 150 mg by mouth 2 (two) times daily.     ocrelizumab 600 mg in sodium chloride 0.9 % 500 mL Inject 600 mg into the vein every 6 (six) months.     modafinil (PROVIGIL) 200 MG tablet Take 1 tablet (200 mg total) by mouth 2 (two) times daily. 60 tablet 0   No facility-administered medications prior to visit.    PAST MEDICAL HISTORY: Past Medical History:  Diagnosis Date   Anxiety    Asthma    Bladder spasms 07/2019   Multiple sclerosis ([place])    Scoliosis     PAST SURGICAL HISTORY: Past Surgical History:  Procedure Laterality Date   BREAST ENHANCEMENT SURGERY  2012    FAMILY HISTORY: Family  History  Problem Relation Age of Onset   Arthritis Mother    Multiple sclerosis Maternal Uncle     SOCIAL HISTORY: Social History   Socioeconomic History   Marital status: Married    Spouse name: Aaron Edelman   Number of children: 0   Years of education: Not on file   Highest education level: GED or equivalent  Occupational History    Comment: real estate agent  Tobacco Use   Smoking  status: Every Day    Years: 1.00    Types: Cigarettes   Smokeless tobacco: Never  Substance and Sexual Activity   Alcohol use: Yes    Comment: occas   Drug use: Yes    Comment: 02/16/20 THC daily   Sexual activity: Not on file  Other Topics Concern   Not on file  Social History Narrative   Lives with husband, step children   Caffeine- not daily   Social Determinants of Health   Financial Resource Strain: Not on file  Food Insecurity: Not on file  Transportation Needs: Not on file  Physical Activity: Not on file  Stress: Not on file  Social Connections: Not on file  Intimate Partner Violence: Not on file     PHYSICAL EXAM Video visit   DIAGNOSTIC DATA (LABS, IMAGING, TESTING) - I reviewed patient records, labs, notes, testing and imaging myself where available.  Lab Results  Component Value Date   WBC 7.0 12/28/2021   HGB 13.9 12/28/2021   HCT 40.4 12/28/2021   MCV 94 12/28/2021   PLT 299 12/28/2021      Component Value Date/Time   NA 141 06/08/2020 1039   K 4.2 06/08/2020 1039   CL 104 06/08/2020 1039   CO2 24 06/08/2020 1039   GLUCOSE 74 06/08/2020 1039   GLUCOSE 87 01/27/2011 0410   BUN 9 06/08/2020 1039   CREATININE 0.68 06/08/2020 1039   CALCIUM 9.1 06/08/2020 1039   PROT 6.4 06/08/2020 1039   ALBUMIN 4.5 06/08/2020 1039   AST 12 06/08/2020 1039   ALT 12 06/08/2020 1039   ALKPHOS 50 06/08/2020 1039   BILITOT 0.4 06/08/2020 1039   GFRNONAA 119 06/08/2020 1039   GFRAA 137 06/08/2020 1039   No results found for: "CHOL", "HDL", "LDLCALC", "LDLDIRECT", "TRIG", "CHOLHDL" No results found for: "HGBA1C" Lab Results  Component Value Date   VITAMINB12 379 02/16/2020   Lab Results  Component Value Date   TSH 2.590 02/16/2020   Vit D, 25-Hydroxy  Date Value Ref Range Status  03/01/2020 31.8 30.0 - 100.0 ng/mL Final    Comment:    Vitamin D deficiency has been defined by the [place] practice guideline as  a level of serum 25-OH vitamin D less than 20 ng/mL (1,2). The Endocrine Society went on to further define vitamin D insufficiency as a level between 21 and 29 ng/mL (2). 1. IOM (Institute of Medicine). 2010. Dietary reference    intakes for calcium and D. [place]: The    [employer]. 2. Holick MF, Binkley [place], Bischoff-Ferrari HA, et al.    Evaluation, treatment, and prevention of vitamin D    deficiency: an Endocrine Society clinical practice    guideline. JCEM. 2011 Jul; 96(7):1911-30.     01/27/20 CBC, CMP - nl a1c 4.6  02/23/20 ACE, ANA, SSA, SSB, HIV, RPR ANCA negative  02/23/20  Hep C Virus Ab 0.0 - 0.9 s/co ratio 0.1     02/23/20  Hep B Core Total Ab Negative Negative    Hepatitis B Surface Ag Negative Negative    Hep B Surface Ab, Qual Reactive     06/15/21  CBC, CMP --> normal  02/24/20 MRI brain (with and without) [I reviewed images myself and agree with interpretation. -VRP]  -Multiple periventricular, subcortical and pericallosal T2 hyperintensities, suspicious for chronic demyelinating disease. -Single left frontal subcortical enhancing lesion measuring 2 mm.  May represent acute demyelinating plaque.  02/18/20 MRI cervical spine (with and without) demonstrating: -T2 hyperintensities in the spinal cord at C3-4 and C5 levels, and within the left middle cerebellar peduncle intracranially.  No abnormal enhancement.  Findings can be seen with autoimmune, inflammatory, demyelinating, postinfectious or vascular etiologies.  02/24/20 MRI thoracic  - Normal MRI thoracic spine (with and without).   02/18/20 MRI lumbar spine  - Unremarkable MRI lumbar spine with and without contrast. No spinal stenosis or foraminal narrowing.   02/01/21  MRI brain with and without contrast demonstrating: -Multiple stable supratentorial and infratentorial chronic demyelinating plaques. -No acute plaques.  No new plaques seen.  02/01/21 MRI cervical spine with  and without contrast demonstrating: -Faint chronic demyelinating plaques within the spinal cord at C3 and C5 levels on sagittal views.  These appear similar to slightly improved compared to MRI from 02/18/2020. -No acute plaques.  No new plaques seen.    ASSESSMENT AND PLAN  32 y.o. year old female here with lower extremity weakness, difficulty with urination, numbness in hands and feet since 2021.  Dx:  1. Relapsing remitting multiple sclerosis ([place])   2. Encounter for therapeutic drug monitoring      PLAN:  RELASPING REMITTING MULTIPLE SCLEROSIS (LOWER EXTREMITY WEAKNESS / BLADDER SPASM / GAIT DIFFICULTY / HYPERREFLEXIA) - continue ocrevus (March / Sept cycle); labs stable - continue multi-vitamin; vitamin D stable - continue baclofen for spasm - check MRI brain in Sept 2024  BRAIN FOG / ANXIETY / DEPRESSION (improved) - continue modafinil 100-200mg  daily  BLADDER ISSUES - continue tamsulosin (incomplete bladder emptying) - consider urodynamic studies per urology (referred back in 2021)  RIGHT FOOT / ANKLE PAIN (prior injuries) - refer to sports medicine - continue physical exercises and strength training  Meds ordered this encounter  Medications   baclofen (LIORESAL) 10 MG tablet    Sig: Take 1 tablet (10 mg total) by mouth 2 (two) times daily.    Dispense:  180 tablet    Refill:  4   modafinil (PROVIGIL) 200 MG tablet    Sig: Take 1 tablet (200 mg total) by mouth 2 (two) times daily.    Dispense:  60 tablet    Refill:  5   Return in about 6 months (around 12/20/2022).  Virtual Visit via Video Note  I connected with Aura Fey on 06/21/22 at 11:30 AM EST by a video enabled telemedicine application and verified that I am speaking with the correct person using two identifiers.   I discussed the limitations of evaluation and management by telemedicine and the availability of in person appointments. The patient expressed understanding and agreed to  proceed.  Patient is at home and I am at the office.   I spent 15 minutes of face-to-face and non-face-to-face time with patient.  This included previsit chart review, lab review, study review, order entry, electronic health record documentation, patient education.       Penni Bombard, MD 8/41/3244, 01:02 AM Certified in Neurology, Neurophysiology and Neuroimaging  [facility] [address], [address]  [place], [place] [address] [phone]

## 2022-06-22 ENCOUNTER — Telehealth: Payer: Self-pay | Admitting: Neurology

## 2022-06-22 NOTE — Telephone Encounter (Signed)
Modafinil PA completed on [employer] [id number] Approved immediately Authorization Expiration Date: 06/22/2023

## 2022-06-25 ENCOUNTER — Encounter: Payer: Self-pay | Admitting: Diagnostic Neuroimaging

## 2022-06-26 ENCOUNTER — Other Ambulatory Visit: Payer: Self-pay | Admitting: Neurology

## 2022-06-26 MED ORDER — TAMSULOSIN HCL 0.4 MG PO CAPS: 0.4000 mg | ORAL_CAPSULE | Freq: Every day | ORAL | 1 refills | Status: DC

## 2022-06-27 ENCOUNTER — Telehealth: Payer: 59 | Admitting: Diagnostic Neuroimaging

## 2022-06-27 ENCOUNTER — Other Ambulatory Visit (INDEPENDENT_AMBULATORY_CARE_PROVIDER_SITE_OTHER): Payer: Self-pay

## 2022-06-27 DIAGNOSIS — Z5181 Encounter for therapeutic drug level monitoring: Secondary | ICD-10-CM

## 2022-06-27 DIAGNOSIS — G35 Multiple sclerosis: Secondary | ICD-10-CM

## 2022-06-27 DIAGNOSIS — Z0289 Encounter for other administrative examinations: Secondary | ICD-10-CM

## 2022-07-01 LAB — CBC WITH DIFFERENTIAL/PLATELET
Basophils Absolute: 0 10*3/uL (ref 0.0–0.2)
Basos: 0 %
EOS (ABSOLUTE): 0.1 10*3/uL (ref 0.0–0.4)
Eos: 2 %
Hematocrit: 38.3 % (ref 34.0–46.6)
Hemoglobin: 12.9 g/dL (ref 11.1–15.9)
Immature Grans (Abs): 0 10*3/uL (ref 0.0–0.1)
Immature Granulocytes: 0 %
Lymphocytes Absolute: 1.6 10*3/uL (ref 0.7–3.1)
Lymphs: 23 %
MCH: 30.6 pg (ref 26.6–33.0)
MCHC: 33.7 g/dL (ref 31.5–35.7)
MCV: 91 fL (ref 79–97)
Monocytes Absolute: 0.4 10*3/uL (ref 0.1–0.9)
Monocytes: 6 %
Neutrophils Absolute: 4.8 10*3/uL (ref 1.4–7.0)
Neutrophils: 69 %
Platelets: 253 10*3/uL (ref 150–450)
RBC: 4.22 x10E6/uL (ref 3.77–5.28)
RDW: 11.9 % (ref 11.7–15.4)
WBC: 6.9 10*3/uL (ref 3.4–10.8)

## 2022-07-01 LAB — IMMUNOGLOBULINS A/E/G/M, SERUM
IgA/Immunoglobulin A, Serum: 239 mg/dL (ref 87–352)
IgE (Immunoglobulin E), Serum: 20 IU/mL (ref 6–495)
IgG (Immunoglobin G), Serum: 698 mg/dL (ref 586–1602)
IgM (Immunoglobulin M), Srm: 57 mg/dL (ref 26–217)

## 2022-07-17 ENCOUNTER — Telehealth: Payer: BC Managed Care – PPO | Admitting: Diagnostic Neuroimaging

## 2022-07-18 DIAGNOSIS — G35 Multiple sclerosis: Secondary | ICD-10-CM | POA: Diagnosis not present

## 2022-07-19 ENCOUNTER — Encounter: Payer: Self-pay | Admitting: Diagnostic Neuroimaging

## 2022-08-08 ENCOUNTER — Encounter: Payer: Self-pay | Admitting: Diagnostic Neuroimaging

## 2022-10-24 ENCOUNTER — Encounter: Payer: Self-pay | Admitting: Diagnostic Neuroimaging

## 2022-10-24 DIAGNOSIS — G35 Multiple sclerosis: Secondary | ICD-10-CM

## 2022-10-24 NOTE — Telephone Encounter (Signed)
Orders Placed This Encounter  Procedures   MR BRAIN W WO CONTRAST   MR CERVICAL SPINE W WO CONTRAST   Suanne Marker, MD 10/24/2022, 5:31 PM Certified in Neurology, Neurophysiology and Neuroimaging  [facility] [address] [place], [place] [address] [phone]

## 2022-10-25 NOTE — Telephone Encounter (Signed)
I tried to get authorization for our mobile unit, but we are out of network with her insurance. I sent the MRIs to [facility]. MRI brain Drucie Opitz: [id number] exp. 10/25/22-04/23/23  MRI cervical spine pending medical review with [employer] for GI case #[id number], sent to GI [phone]

## 2022-10-30 DIAGNOSIS — M5417 Radiculopathy, lumbosacral region: Secondary | ICD-10-CM | POA: Diagnosis not present

## 2022-10-30 DIAGNOSIS — M9904 Segmental and somatic dysfunction of sacral region: Secondary | ICD-10-CM | POA: Diagnosis not present

## 2022-10-30 DIAGNOSIS — M9902 Segmental and somatic dysfunction of thoracic region: Secondary | ICD-10-CM | POA: Diagnosis not present

## 2022-10-30 DIAGNOSIS — M9901 Segmental and somatic dysfunction of cervical region: Secondary | ICD-10-CM | POA: Diagnosis not present

## 2022-10-30 DIAGNOSIS — M9903 Segmental and somatic dysfunction of lumbar region: Secondary | ICD-10-CM | POA: Diagnosis not present

## 2022-10-30 NOTE — Telephone Encounter (Signed)
MRI cervical spine auth: [id number] exp. 10/25/2022-04/24/2023 for GI

## 2022-11-07 ENCOUNTER — Encounter: Payer: Self-pay | Admitting: Diagnostic Neuroimaging

## 2022-11-08 MED ORDER — PREDNISONE 10 MG PO TABS: ORAL_TABLET | ORAL | 0 refills | Status: DC

## 2022-11-08 NOTE — Telephone Encounter (Signed)
Will send in prednisone; also will follow up MRI scans; pls setup for later this month, see orders. -VRP   Meds ordered this encounter  Medications   predniSONE (DELTASONE) 10 MG tablet    Sig: Take 60mg  on day 1. Reduce by 10mg  each subsequent day. (60, 50, 40, 30, 20, 10, stop)    Dispense:  21 tablet    Refill:  0   Suanne Marker, MD 11/08/2022, 10:09 AM Certified in Neurology, Neurophysiology and Neuroimaging  [facility] [address], [address] [place], [place] [address] [phone]

## 2022-11-20 ENCOUNTER — Encounter: Payer: Self-pay | Admitting: Diagnostic Neuroimaging

## 2022-11-22 ENCOUNTER — Ambulatory Visit
Admission: RE | Admit: 2022-11-22 | Discharge: 2022-11-22 | Disposition: A | Discharge status: Home / Self Care | Payer: 59 | Source: Ambulatory Visit | Attending: Diagnostic Neuroimaging | Admitting: Diagnostic Neuroimaging

## 2022-11-22 DIAGNOSIS — G35 Multiple sclerosis: Secondary | ICD-10-CM | POA: Diagnosis not present

## 2022-11-22 MED ORDER — GADOPICLENOL 0.5 MMOL/ML IV SOLN: 7.5000 mL | Freq: Once | INTRAVENOUS | Status: DC | PRN

## 2022-11-22 MED ORDER — GADOPICLENOL 0.5 MMOL/ML IV SOLN
7.5000 mL | Freq: Once | INTRAVENOUS | Status: AC | PRN
  Administered 2022-11-22: 7.5 mL via INTRAVENOUS

## 2022-11-24 ENCOUNTER — Encounter: Payer: Self-pay | Admitting: Diagnostic Neuroimaging

## 2022-12-06 ENCOUNTER — Other Ambulatory Visit: Payer: 59

## 2022-12-18 ENCOUNTER — Encounter: Payer: Self-pay | Admitting: Diagnostic Neuroimaging

## 2022-12-18 ENCOUNTER — Ambulatory Visit: Payer: 59 | Admitting: Diagnostic Neuroimaging

## 2022-12-18 VITALS — BP 109/74 | HR 94 | Ht 68.0 in | Wt 153.0 lb

## 2022-12-18 DIAGNOSIS — R0602 Shortness of breath: Secondary | ICD-10-CM

## 2022-12-18 DIAGNOSIS — Z8659 Personal history of other mental and behavioral disorders: Secondary | ICD-10-CM | POA: Diagnosis not present

## 2022-12-18 DIAGNOSIS — F419 Anxiety disorder, unspecified: Secondary | ICD-10-CM | POA: Diagnosis not present

## 2022-12-18 DIAGNOSIS — M6281 Muscle weakness (generalized): Secondary | ICD-10-CM | POA: Diagnosis not present

## 2022-12-18 DIAGNOSIS — G35 Multiple sclerosis: Secondary | ICD-10-CM | POA: Diagnosis not present

## 2022-12-18 DIAGNOSIS — K589 Irritable bowel syndrome without diarrhea: Secondary | ICD-10-CM | POA: Diagnosis not present

## 2022-12-18 DIAGNOSIS — R002 Palpitations: Secondary | ICD-10-CM | POA: Diagnosis not present

## 2022-12-18 MED ORDER — MODAFINIL 200 MG PO TABS: 200.0000 mg | ORAL_TABLET | Freq: Two times a day (BID) | ORAL | 5 refills | Status: DC

## 2022-12-18 MED ORDER — SERTRALINE HCL 25 MG PO TABS: 25.0000 mg | ORAL_TABLET | Freq: Every day | ORAL | 6 refills | Status: DC

## 2022-12-18 MED ORDER — BACLOFEN 10 MG PO TABS: 10.0000 mg | ORAL_TABLET | Freq: Two times a day (BID) | ORAL | 4 refills | Status: AC

## 2022-12-18 NOTE — Progress Notes (Signed)
[facility]  PATIENT: Paige Davis DOB: Jul 09, 1990  REFERRING CLINICIAN: [facility] HISTORY FROM: patient  REASON FOR VISIT: follow up    HISTORICAL  CHIEF COMPLAINT:  Chief Complaint  Patient presents with   Follow-up    Rm 7, here alone Pt is following up on MS. Pt states she has been having been forgetting words, repeating herself and states feeling foggy.     HISTORY OF PRESENT ILLNESS:   UPDATE (12/18/22, VRP): Since last visit, more anxiety, brain fog issues. Some GI and heart symptoms lately. Needs new PCP. Tolerating meds.   UPDATE (06/21/22, VRP): Since last visit, doing well. Mood, energy, brain fog are improved. No alleviating or aggravating factors. Tolerating meds.  UPDATE (07/13/21, VRP): Since last visit, had 2nd opinion at [facility]. Plan recs to stay the same. Still with more fatigue. Sleeping 7-8 hours at night. Spasms controlled with baclofen. Tolerating ocrevus.   UPDATE (01/12/21, VRP): Since last visit, doing poorly; intermittent crossed vision with heat; more anxiety, diff focusing. Modafinil not helping. OCT testing showed some decreased in RNFL (88um). She is concerned about her prognosis. Tolerating ocrevus. Bladder still affected.   UPDATE (07/07/20, VRP): Since last visit, doing much better since starting Ocrevus and getting Solu-Medrol.  Still has some fatigue, brain fog, twitching sensations.  Sometimes when she moves her eyes rapidly she feels slight blurred or crossed vision.  She has stopped smoking.  She is on Wellbutrin doing well.   UPDATE (03/10/20, VRP): Since last visit, now diagnosed with multiple sclerosis.  MRI and lab testing reviewed.  Planning to initiate Ocrevus.  Patient continues to have issues with fatigue, gait difficulty, incomplete bladder emptying.   PRIOR HPI (02/16/20): 32 year old female here for evaluation of lower extremity weakness.  March 2021 patient was having some difficulty initiating  urination, diagnosed with UTIs and treated. She was also having generalized fatigue, muscle weakness in legs, aching sensation in legs. Patient got married in April 2021 and symptoms continued. She was having continuing weakness in the legs and gait difficulty. She was having intermittent numbness in fingers and toes.  Patient went to chiropractor and had some treatments. Patient referred here for further neurologic testing and evaluation.   REVIEW OF SYSTEMS: Full 14 system review of systems performed and negative with exception of: As per HPI.  ALLERGIES: No Known Allergies  HOME MEDICATIONS: Outpatient Medications Prior to Visit  Medication Sig Dispense Refill   ALPRAZolam (XANAX) 0.5 MG tablet Take 0.5 mg by mouth as needed for anxiety.     ocrelizumab 600 mg in sodium chloride 0.9 % 500 mL Inject 600 mg into the vein every 6 (six) months.     tamsulosin (FLOMAX) 0.4 MG CAPS capsule Take 1 capsule (0.4 mg total) by mouth daily. 90 capsule 1   baclofen (LIORESAL) 10 MG tablet Take 1 tablet (10 mg total) by mouth 2 (two) times daily. 180 tablet 4   modafinil (PROVIGIL) 200 MG tablet Take 1 tablet (200 mg total) by mouth 2 (two) times daily. 60 tablet 5   predniSONE (DELTASONE) 10 MG tablet Take 60mg  on day 1. Reduce by 10mg  each subsequent day. (60, 50, 40, 30, 20, 10, stop) 21 tablet 0   No facility-administered medications prior to visit.    PAST MEDICAL HISTORY: Past Medical History:  Diagnosis Date   Anxiety    Asthma    Bladder spasms 07/2019   Multiple sclerosis (HCC)    Scoliosis     PAST SURGICAL  HISTORY: Past Surgical History:  Procedure Laterality Date   BREAST ENHANCEMENT SURGERY  2012    FAMILY HISTORY: Family History  Problem Relation Age of Onset   Arthritis Mother    Multiple sclerosis Maternal Uncle     SOCIAL HISTORY: Social History   Socioeconomic History   Marital status: Married    Spouse name: Arlys John   Number of children: 0   Years of  education: Not on file   Highest education level: GED or equivalent  Occupational History    Comment: real estate agent  Tobacco Use   Smoking status: Every Day    Types: Cigarettes   Smokeless tobacco: Never  Substance and Sexual Activity   Alcohol use: Yes    Comment: occas   Drug use: Yes    Comment: 02/16/20 THC daily   Sexual activity: Not on file  Other Topics Concern   Not on file  Social History Narrative   Lives with husband, step children   Caffeine- not daily   Social Determinants of Health   Financial Resource Strain: Not on file  Food Insecurity: Not on file  Transportation Needs: Not on file  Physical Activity: Not on file  Stress: Not on file  Social Connections: Unknown (09/06/2021)   Received from [facility]   Social Network    Social Network: Not on file  Intimate Partner Violence: Unknown (08/10/2021)   Received from [facility]   HITS    Physically Hurt: Not on file    Insult or Talk Down To: Not on file    Threaten Physical Harm: Not on file    Scream or Curse: Not on file     PHYSICAL EXAM  GENERAL EXAM/CONSTITUTIONAL: Vitals:  Vitals:   12/18/22 1346  BP: 109/74  Pulse: 94  Weight: 153 lb (69.4 kg)  Height: 5\' 8"  (1.727 m)   Body mass index is 23.26 kg/m. Wt Readings from Last 3 Encounters:  12/18/22 153 lb (69.4 kg)  07/13/21 151 lb (68.5 kg)  01/14/21 150 lb (68 kg)   Patient is in no distress; well developed, nourished and groomed; neck is supple  CARDIOVASCULAR: Examination of carotid arteries is normal; no carotid bruits Regular rate and rhythm, no murmurs Examination of peripheral vascular system by observation and palpation is normal  EYES: Ophthalmoscopic exam of optic discs and posterior segments is normal; no papilledema or hemorrhages No results found.  MUSCULOSKELETAL: Gait, strength, tone, movements noted in Neurologic exam below  NEUROLOGIC: MENTAL STATUS:     12/18/2022    1:48 PM  MMSE - Mini Mental  State Exam  Orientation to time 4  Orientation to Place 5  Registration 3  Attention/ Calculation 4  Recall 2  Language- name 2 objects 2  Language- repeat 1  Language- follow 3 step command 3  Language- read & follow direction 1  Write a sentence 1  Copy design 1  Total score 27   awake, alert, oriented to person, place and time recent and remote memory intact normal attention and concentration language fluent, comprehension intact, naming intact fund of knowledge appropriate  CRANIAL NERVE:  2nd - no papilledema on fundoscopic exam 2nd, 3rd, 4th, 6th - pupils equal and reactive to light, visual fields full to confrontation, extraocular muscles intact, no nystagmus 5th - facial sensation symmetric 7th - facial strength symmetric 8th - hearing intact 9th - palate elevates symmetrically, uvula midline 11th - shoulder shrug symmetric 12th - tongue protrusion midline  MOTOR:  normal bulk  and tone, full strength in the BUE, BLE  SENSORY:  normal and symmetric to light touch, pinprick, temperature, vibration  COORDINATION:  finger-nose-finger, fine finger movements normal  REFLEXES:  deep tendon reflexes present and symmetric  GAIT/STATION:  narrow based gait; able to walk on toes, heels and tandem; romberg is negative    DIAGNOSTIC DATA (LABS, IMAGING, TESTING) - I reviewed patient records, labs, notes, testing and imaging myself where available.  Lab Results  Component Value Date   WBC 6.9 06/27/2022   HGB 12.9 06/27/2022   HCT 38.3 06/27/2022   MCV 91 06/27/2022   PLT 253 06/27/2022      Component Value Date/Time   NA 141 06/08/2020 1039   K 4.2 06/08/2020 1039   CL 104 06/08/2020 1039   CO2 24 06/08/2020 1039   GLUCOSE 74 06/08/2020 1039   GLUCOSE 87 01/27/2011 0410   BUN 9 06/08/2020 1039   CREATININE 0.68 06/08/2020 1039   CALCIUM 9.1 06/08/2020 1039   PROT 6.4 06/08/2020 1039   ALBUMIN 4.5 06/08/2020 1039   AST 12 06/08/2020 1039   ALT 12  06/08/2020 1039   ALKPHOS 50 06/08/2020 1039   BILITOT 0.4 06/08/2020 1039   GFRNONAA 119 06/08/2020 1039   GFRAA 137 06/08/2020 1039   No results found for: "CHOL", "HDL", "LDLCALC", "LDLDIRECT", "TRIG", "CHOLHDL" No results found for: "HGBA1C" Lab Results  Component Value Date   VITAMINB12 379 02/16/2020   Lab Results  Component Value Date   TSH 2.590 02/16/2020   Vit D, 25-Hydroxy  Date Value Ref Range Status  03/01/2020 31.8 30.0 - 100.0 ng/mL Final    Comment:    Vitamin D deficiency has been defined by the Institute of Medicine and an Endocrine Society practice guideline as a level of serum 25-OH vitamin D less than 20 ng/mL (1,2). The Endocrine Society went on to further define vitamin D insufficiency as a level between 21 and 29 ng/mL (2). 1. IOM (Institute of Medicine). 2010. Dietary reference    intakes for calcium and D. [place]: The    [employer]. 2. Holick MF, Binkley Applewood, Bischoff-Ferrari HA, et al.    Evaluation, treatment, and prevention of vitamin D    deficiency: an Endocrine Society clinical practice    guideline. JCEM. 2011 Jul; 96(7):1911-30.     01/27/20 CBC, CMP - nl a1c 4.6  02/23/20 ACE, ANA, SSA, SSB, HIV, RPR ANCA negative  02/23/20  Hep C Virus Ab 0.0 - 0.9 s/co ratio 0.1     02/23/20      Hep B Core Total Ab Negative Negative    Hepatitis B Surface Ag Negative Negative    Hep B Surface Ab, Qual Reactive     06/15/21  CBC, CMP --> normal  02/24/20 MRI brain (with and without) [I reviewed images myself and agree with interpretation. -VRP]  -Multiple periventricular, subcortical and pericallosal T2 hyperintensities, suspicious for chronic demyelinating disease. -Single left frontal subcortical enhancing lesion measuring 2 mm.  May represent acute demyelinating plaque.  02/18/20 MRI cervical spine (with and without) demonstrating: -T2 hyperintensities in the spinal cord at C3-4 and C5 levels, and within the left  middle cerebellar peduncle intracranially.  No abnormal enhancement.  Findings can be seen with autoimmune, inflammatory, demyelinating, postinfectious or vascular etiologies.  02/24/20 MRI thoracic  - Normal MRI thoracic spine (with and without).   02/18/20 MRI lumbar spine  - Unremarkable MRI lumbar spine with and without contrast. No spinal stenosis or foraminal narrowing.  02/01/21  MRI brain with and without contrast demonstrating: -Multiple stable supratentorial and infratentorial chronic demyelinating plaques. -No acute plaques.  No new plaques seen.  02/01/21 MRI cervical spine with and without contrast demonstrating: -Faint chronic demyelinating plaques within the spinal cord at C3 and C5 levels on sagittal views.  These appear similar to slightly improved compared to MRI from 02/18/2020. -No acute plaques.  No new plaques seen.    ASSESSMENT AND PLAN  32 y.o. year old female here with lower extremity weakness, difficulty with urination, numbness in hands and feet since 2021.  Dx:  1. Relapsing remitting multiple sclerosis (HCC)   2. Anxiety   3. History of OCD (obsessive compulsive disorder)   4. Muscle weakness   5. Palpitation   6. Shortness of breath   7. Irritable bowel syndrome, unspecified type     PLAN:  RELASPING REMITTING MULTIPLE SCLEROSIS (LOWER EXTREMITY WEAKNESS / BLADDER SPASM / GAIT DIFFICULTY / HYPERREFLEXIA) - continue ocrevus (March / Sept cycle); labs stable - continue multi-vitamin; vitamin D stable - continue baclofen for spasm  BRAIN FOG / ANXIETY / DEPRESSION / OCD - continue modafinil 200mg  (1-2 times per day) - trial of sertraline 25mg  daily  INTERMITTENT PALPITATIONS, TACHYCARDIA, BRADYCARDIA, SHORTNESS OF BREATH - refer to cardiology  IBS symptoms - refer to GI  BLADDER ISSUES - continue tamsulosin (incomplete bladder emptying) - consider urodynamic studies per urology (referred back in 2021)  RIGHT FOOT / ANKLE PAIN (prior  injuries) - refer to sports medicine - continue physical exercises and strength training  Meds ordered this encounter  Medications   sertraline (ZOLOFT) 25 MG tablet    Sig: Take 1 tablet (25 mg total) by mouth daily.    Dispense:  30 tablet    Refill:  6   baclofen (LIORESAL) 10 MG tablet    Sig: Take 1 tablet (10 mg total) by mouth 2 (two) times daily.    Dispense:  180 tablet    Refill:  4   modafinil (PROVIGIL) 200 MG tablet    Sig: Take 1 tablet (200 mg total) by mouth 2 (two) times daily.    Dispense:  60 tablet    Refill:  5   Orders Placed This Encounter  Procedures   CBC with Differential/Platelet   Immunoglobulins, QN, A/E/G/M   Ambulatory referral to Psychiatry   Ambulatory referral to Cardiology   Ambulatory referral to Gastroenterology   Return in about 6 months (around 06/20/2023).     Suanne Marker, MD 12/18/2022, 2:43 PM Certified in Neurology, Neurophysiology and Neuroimaging  [facility] [address] [place], [place] [address] [phone]

## 2022-12-20 ENCOUNTER — Telehealth: Payer: Self-pay | Admitting: Diagnostic Neuroimaging

## 2022-12-20 NOTE — Telephone Encounter (Signed)
Referral for psychiatry fax to [facility] at [facility]. Phone: [phone], Fax: [phone].

## 2023-01-10 ENCOUNTER — Other Ambulatory Visit: Payer: Self-pay | Admitting: Diagnostic Neuroimaging

## 2023-01-10 ENCOUNTER — Encounter: Payer: Self-pay | Admitting: Diagnostic Neuroimaging

## 2023-01-25 DIAGNOSIS — G35 Multiple sclerosis: Secondary | ICD-10-CM | POA: Diagnosis not present

## 2023-03-13 ENCOUNTER — Ambulatory Visit: Payer: 59 | Attending: Cardiovascular Disease | Admitting: Cardiovascular Disease

## 2023-03-13 ENCOUNTER — Encounter: Payer: Self-pay | Admitting: Cardiovascular Disease

## 2023-03-13 VITALS — BP 108/76 | HR 86 | Ht 68.0 in | Wt 153.2 lb

## 2023-03-13 DIAGNOSIS — R002 Palpitations: Secondary | ICD-10-CM

## 2023-03-13 DIAGNOSIS — R55 Syncope and collapse: Secondary | ICD-10-CM

## 2023-03-13 DIAGNOSIS — R0602 Shortness of breath: Secondary | ICD-10-CM

## 2023-03-13 NOTE — Progress Notes (Signed)
  Cardiology Office Note:  .   Date:  03/13/2023  ID:  Paige Davis, DOB [birth year], MRN [id number] PCP: [facility]  [facility] Providers Cardiologist:  None    History of Present Illness: . 03/13/23   Paige Davis is a 32 y.o. 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[employer] ,  was with [employer] until this year )     ROS:   Studies Reviewed: Marland Kitchen   EKG Interpretation Date/Time:  Tuesday March 13 2023 15:48:31 EST Ventricular Rate:  95 PR Interval:  132 QRS Duration:  82 QT Interval:  324 QTC Calculation: 407 R Axis:   50  Text Interpretation: Normal sinus rhythm Nonspecific ST and T wave abnormality No previous ECGs available Confirmed by Kristeen Miss [phone]) on 03/13/2023 3:52:52 PM    EKG Interpretation Date/Time:  Tuesday March 13 2023 15:48:31 EST Ventricular Rate:  95 PR Interval:  132 QRS Duration:  82 QT Interval:  324 QTC Calculation: 407 R Axis:   50  Text Interpretation: Normal sinus rhythm Nonspecific ST and T wave abnormality No previous ECGs available Confirmed by Kristeen Miss ([id number]) on 03/13/2023 3:52:52 PM   Risk Assessment/Calculations:             Physical Exam:   VS:  BP 108/76   Pulse 86   Ht 5\' 8"  (1.727 m)   Wt 153 lb 3.2 oz (69.5 kg)   SpO2 98%   BMI 23.29 kg/m    Wt Readings from Last 3 Encounters:  03/13/23 153 lb 3.2 oz (69.5 kg)  12/18/22 153 lb (69.4 kg)   07/13/21 151 lb (68.5 kg)    GEN: Well nourished, well developed in no acute distress NECK: No JVD; No carotid bruits CARDIAC: RRR, no murmurs, rubs, gallops RESPIRATORY:  Clear to auscultation without rales, wheezing or rhonchi  ABDOMEN: Soft, non-tender, non-distended EXTREMITIES:  No edema; No deformity   ASSESSMENT AND PLAN: .   Palpitations: Paige Davis presents for further evaluation and management of palpitations as well as presyncope.  She has multiple sclerosis.  She is on Provigil which can cause palpitations.  Her dose was recently increased to 200 mg twice a day and she thinks that her palpitations worsened at that time.  Will place non-live 28-day monitor on her for further evaluation.  I have asked her to check in with her neurologist to see if there are any other options that might work for her MS   TSH was normal in Oct. 2021  Will recheck TSH   2.  Presyncope: Cardiac  exam is fairly unremarkable.  I would like to get an echocardiogram for further evaluation.       Dispo: 3 months   Signed, Kristeen Miss, MD

## 2023-03-13 NOTE — Patient Instructions (Signed)
Testing/Procedures: 30-day heart monitor Your physician has recommended that you wear an event monitor. Event monitors are medical devices that record the heart's electrical activity. Doctors most often [place] these monitors to diagnose arrhythmias. Arrhythmias are problems with the speed or rhythm of the heartbeat. The monitor is a small, portable device. You can wear one while you do your normal daily activities. This is usually used to diagnose what is causing palpitations/syncope (passing out).  ECHO Your physician has requested that you have an echocardiogram. Echocardiography is a painless test that uses sound waves to create images of your heart. It provides your doctor with information about the size and shape of your heart and how well your heart's chambers and valves are working. This procedure takes approximately one hour. There are no restrictions for this procedure. Please do NOT wear cologne, perfume, aftershave, or lotions (deodorant is allowed). Please arrive 15 minutes prior to your appointment time.  Please note: We ask at that you not bring children with you during ultrasound (echo/ vascular) testing. Due to room size and safety concerns, children are not allowed in the ultrasound rooms during exams. Our front office staff cannot provide observation of children in our lobby area while testing is being conducted. An adult accompanying a patient to their appointment will only be allowed in the ultrasound room at the discretion of the ultrasound technician under special circumstances. We apologize for any inconvenience.  Follow-Up: At [facility], you and your health needs are our priority.  As part of our continuing mission to provide you with exceptional heart care, we have created designated Provider Care Teams.  These Care Teams include your primary Cardiologist (physician) and Advanced Practice Providers (APPs -  Physician Assistants and Nurse Practitioners) who all work  together to provide you with the care you need, when you need it.  Your next appointment:   3 month(s)  Provider:   Kristeen Miss, MD

## 2023-03-14 LAB — TSH: TSH: 4.3 u[IU]/mL (ref 0.450–4.500)

## 2023-03-20 DIAGNOSIS — R002 Palpitations: Secondary | ICD-10-CM | POA: Diagnosis not present

## 2023-03-20 DIAGNOSIS — R55 Syncope and collapse: Secondary | ICD-10-CM | POA: Diagnosis not present

## 2023-03-21 DIAGNOSIS — F422 Mixed obsessional thoughts and acts: Secondary | ICD-10-CM | POA: Diagnosis not present

## 2023-03-21 DIAGNOSIS — F411 Generalized anxiety disorder: Secondary | ICD-10-CM | POA: Diagnosis not present

## 2023-03-22 ENCOUNTER — Telehealth: Payer: Self-pay | Admitting: Cardiovascular Disease

## 2023-03-22 NOTE — Telephone Encounter (Signed)
[employer] is calling to report a critical EKG on patient

## 2023-03-22 NOTE — Telephone Encounter (Signed)
   Cardiac Monitor Alert  Date of alert:  03/22/2023   Patient Name: Paige Davis  DOB: 08-29-1990  MRN: [id number]   [facility] Cardiologist: None  [facility] EP:  None    Monitor Information: Cardiac Event Monitor [[employer]]  Reason:  Syncope and collapse, palpitations Ordering provider:  Dr Kristeen Miss   Alert Sinus Tachycardia This is the 1st alert for this rhythm.   Next Cardiology Appointment   Date:  04/20/23 - Echo 06/22/23 Provider:  Nahser  The patient was contacted today.  She was symptomatic.  She reports the following symptoms:  pressure some SOB from pressure. Arrhythmia, symptoms and history reviewed with Dr Eden Emms (DOD).   Plan:  Continue to monitor  Other: Pt was symptomatic when she triggered the alert. Went away after a few minutes.   Richelle Ito, RN  03/22/2023 9:46 AM

## 2023-04-04 DIAGNOSIS — F411 Generalized anxiety disorder: Secondary | ICD-10-CM | POA: Diagnosis not present

## 2023-04-04 DIAGNOSIS — F422 Mixed obsessional thoughts and acts: Secondary | ICD-10-CM | POA: Diagnosis not present

## 2023-04-11 ENCOUNTER — Ambulatory Visit: Payer: Self-pay | Attending: Cardiovascular Disease

## 2023-04-11 ENCOUNTER — Telehealth (HOSPITAL_COMMUNITY): Payer: Self-pay | Admitting: Cardiovascular Disease

## 2023-04-11 DIAGNOSIS — R002 Palpitations: Secondary | ICD-10-CM

## 2023-04-11 DIAGNOSIS — R55 Syncope and collapse: Secondary | ICD-10-CM

## 2023-04-11 NOTE — Telephone Encounter (Signed)
Patient cancelled echocardiogram for the reason below:  04/11/2023 3:02 PMTERRY, [name] Cancel [id number] (states she found out she has tachycardia and appointment is no longer needed)   Order will be removed from the active echo WQ. Thank you.

## 2023-04-20 ENCOUNTER — Other Ambulatory Visit (HOSPITAL_COMMUNITY): Payer: 59

## 2023-05-12 ENCOUNTER — Other Ambulatory Visit: Payer: Self-pay | Admitting: Diagnostic Neuroimaging

## 2023-05-17 NOTE — Telephone Encounter (Signed)
 Rx refilled per last office visit note.

## 2023-06-19 ENCOUNTER — Encounter: Payer: Self-pay | Admitting: Cardiovascular Disease

## 2023-06-19 ENCOUNTER — Ambulatory Visit (INDEPENDENT_AMBULATORY_CARE_PROVIDER_SITE_OTHER): Payer: Self-pay | Admitting: Diagnostic Neuroimaging

## 2023-06-19 ENCOUNTER — Encounter: Payer: Self-pay | Admitting: Diagnostic Neuroimaging

## 2023-06-19 VITALS — BP 131/76 | HR 70 | Ht 68.0 in | Wt 157.0 lb

## 2023-06-19 DIAGNOSIS — G35 Multiple sclerosis: Secondary | ICD-10-CM

## 2023-06-19 NOTE — Patient Instructions (Signed)
RELASPING REMITTING MULTIPLE SCLEROSIS (LOWER EXTREMITY WEAKNESS / BLADDER SPASM / GAIT DIFFICULTY / HYPERREFLEXIA) - continue ocrevus (March / Sept cycle); labs stable - continue multi-vitamin; vitamin D stable - continue baclofen for spasm  BRAIN FOG / ANXIETY / DEPRESSION / OCD - wean off modafinil (200mg  daily x 1-2 weeks, then stop) - may start vyvanse per psychiatry (ok to overlap)

## 2023-06-19 NOTE — Progress Notes (Signed)
[facility]  PATIENT: Paige Davis DOB: 15-Sep-1990  REFERRING CLINICIAN: [facility] HISTORY FROM: patient  REASON FOR VISIT: follow up    HISTORICAL  CHIEF COMPLAINT:  Chief Complaint  Patient presents with   Follow-up    Pt in room 7 alone. Here for MS follow up on Ocrevus. Pt report doing well, infusion scheduled next month.     HISTORY OF PRESENT ILLNESS:   UPDATE (06/19/23, VRP): Since last visit, doing well! Tolerating ocrevus. Interested in changing modafinil to vyvanse per psychiatry to help adhd and reduce palpitations. Overall balance and coordination better. Also now on lamotrigine for bipolar and sxs are better.   UPDATE (12/18/22, VRP): Since last visit, more anxiety, brain fog issues. Some GI and heart symptoms lately. Needs new PCP. Tolerating meds.   UPDATE (06/21/22, VRP): Since last visit, doing well. Mood, energy, brain fog are improved. No alleviating or aggravating factors. Tolerating meds.  UPDATE (07/13/21, VRP): Since last visit, had 2nd opinion at [facility]. Plan recs to stay the same. Still with more fatigue. Sleeping 7-8 hours at night. Spasms controlled with baclofen. Tolerating ocrevus.   UPDATE (01/12/21, VRP): Since last visit, doing poorly; intermittent crossed vision with heat; more anxiety, diff focusing. Modafinil not helping. OCT testing showed some decreased in RNFL (88um). She is concerned about her prognosis. Tolerating ocrevus. Bladder still affected.   UPDATE (07/07/20, VRP): Since last visit, doing much better since starting Ocrevus and getting Solu-Medrol.  Still has some fatigue, brain fog, twitching sensations.  Sometimes when she moves her eyes rapidly she feels slight blurred or crossed vision.  She has stopped smoking.  She is on Wellbutrin doing well.   UPDATE (03/10/20, VRP): Since last visit, now diagnosed with multiple sclerosis.  MRI and lab testing reviewed.  Planning to initiate Ocrevus.  Patient continues  to have issues with fatigue, gait difficulty, incomplete bladder emptying.   PRIOR HPI (02/16/20): 33 year old female here for evaluation of lower extremity weakness.  March 2021 patient was having some difficulty initiating urination, diagnosed with UTIs and treated. She was also having generalized fatigue, muscle weakness in legs, aching sensation in legs. Patient got married in April 2021 and symptoms continued. She was having continuing weakness in the legs and gait difficulty. She was having intermittent numbness in fingers and toes.  Patient went to chiropractor and had some treatments. Patient referred here for further neurologic testing and evaluation.   REVIEW OF SYSTEMS: Full 14 system review of systems performed and negative with exception of: As per HPI.  ALLERGIES: No Known Allergies  HOME MEDICATIONS: Outpatient Medications Prior to Visit  Medication Sig Dispense Refill   ALPRAZolam (XANAX) 0.5 MG tablet Take 0.5 mg by mouth as needed for anxiety.     baclofen (LIORESAL) 10 MG tablet Take 1 tablet (10 mg total) by mouth 2 (two) times daily. 180 tablet 4   lamoTRIgine (LAMICTAL) 25 MG tablet Take 25 mg by mouth 2 (two) times daily.     modafinil (PROVIGIL) 200 MG tablet Take 1 tablet (200 mg total) by mouth 2 (two) times daily. 60 tablet 5   ocrelizumab 600 mg in sodium chloride 0.9 % 500 mL Inject 600 mg into the vein every 6 (six) months.     tamsulosin (FLOMAX) 0.4 MG CAPS capsule TAKE 1 CAPSULE BY MOUTH EVERY DAY 90 capsule 1   No facility-administered medications prior to visit.    PAST MEDICAL HISTORY: Past Medical History:  Diagnosis Date   Anxiety  Asthma    Bladder spasms 07/2019   Multiple sclerosis (HCC)    OCD (obsessive compulsive disorder)    Scoliosis     PAST SURGICAL HISTORY: Past Surgical History:  Procedure Laterality Date   BREAST ENHANCEMENT SURGERY  2012    FAMILY HISTORY: Family History  Problem Relation Age of Onset   Arthritis  Mother    Multiple sclerosis Maternal Uncle     SOCIAL HISTORY: Social History   Socioeconomic History   Marital status: Married    Spouse name: Arlys John   Number of children: 0   Years of education: Not on file   Highest education level: GED or equivalent  Occupational History    Comment: real estate agent  Tobacco Use   Smoking status: Former    Current packs/day: 0.00    Average packs/day: 1 pack/day for 10.0 years (10.0 ttl pk-yrs)    Types: Cigarettes    Start date: 2009    Quit date: 2019    Years since quitting: 6.1   Smokeless tobacco: Never  Vaping Use   Vaping status: Every Day   Substances: THC  Substance and Sexual Activity   Alcohol use: Yes    Alcohol/week: 2.0 standard drinks of alcohol    Types: 2 Glasses of wine per week    Comment: occas   Drug use: Yes    Types: Marijuana    Comment: 02/16/20 THC daily   Sexual activity: Yes    Comment: Husband had vasectomy  Other Topics Concern   Not on file  Social History Narrative   Lives with husband, step children   Caffeine- not daily   Social Drivers of Corporate investment banker Strain: Not on file  Food Insecurity: Not on file  Transportation Needs: Not on file  Physical Activity: Not on file  Stress: Not on file  Social Connections: Unknown (09/06/2021)   Received from [facility]   Social Network    Social Network: Not on file  Intimate Partner Violence: Unknown (08/10/2021)   Received from [employer], [facility]   HITS    Physically Hurt: Not on file    Insult or Talk Down To: Not on file    Threaten Physical Harm: Not on file    Scream or Curse: Not on file     PHYSICAL EXAM  GENERAL EXAM/CONSTITUTIONAL: Vitals:  Vitals:   06/19/23 1512  BP: 131/76  Pulse: 70  Weight: 157 lb (71.2 kg)  Height: 5\' 8"  (1.727 m)   Body mass index is 23.87 kg/m. Wt Readings from Last 3 Encounters:  06/19/23 157 lb (71.2 kg)  03/13/23 153 lb 3.2 oz (69.5 kg)  12/18/22 153 lb  (69.4 kg)   Patient is in no distress; well developed, nourished and groomed; neck is supple  CARDIOVASCULAR: Examination of carotid arteries is normal; no carotid bruits Regular rate and rhythm, no murmurs Examination of peripheral vascular system by observation and palpation is normal  EYES: Ophthalmoscopic exam of optic discs and posterior segments is normal; no papilledema or hemorrhages No results found.  MUSCULOSKELETAL: Gait, strength, tone, movements noted in Neurologic exam below  NEUROLOGIC: MENTAL STATUS:     12/18/2022    1:48 PM  MMSE - Mini Mental State Exam  Orientation to time 4  Orientation to Place 5  Registration 3  Attention/ Calculation 4  Recall 2  Language- name 2 objects 2  Language- repeat 1  Language- follow 3 step command 3  Language- read &  follow direction 1  Write a sentence 1  Copy design 1  Total score 27   awake, alert, oriented to person, place and time recent and remote memory intact normal attention and concentration language fluent, comprehension intact, naming intact fund of knowledge appropriate  CRANIAL NERVE:  2nd - no papilledema on fundoscopic exam 2nd, 3rd, 4th, 6th - pupils equal and reactive to light, visual fields full to confrontation, extraocular muscles intact, no nystagmus 5th - facial sensation symmetric 7th - facial strength symmetric 8th - hearing intact 9th - palate elevates symmetrically, uvula midline 11th - shoulder shrug symmetric 12th - tongue protrusion midline  MOTOR:  normal bulk and tone, full strength in the BUE, BLE  SENSORY:  normal and symmetric to light touch  COORDINATION:  finger-nose-finger, fine finger movements normal  REFLEXES:  deep tendon reflexes present and symmetric  GAIT/STATION:  narrow based gait    DIAGNOSTIC DATA (LABS, IMAGING, TESTING) - I reviewed patient records, labs, notes, testing and imaging myself where available.  Lab Results  Component Value Date    WBC 6.6 12/18/2022   HGB 13.6 12/18/2022   HCT 41.3 12/18/2022   MCV 93 12/18/2022   PLT 278 12/18/2022      Component Value Date/Time   NA 141 06/08/2020 1039   K 4.2 06/08/2020 1039   CL 104 06/08/2020 1039   CO2 24 06/08/2020 1039   GLUCOSE 74 06/08/2020 1039   GLUCOSE 87 01/27/2011 0410   BUN 9 06/08/2020 1039   CREATININE 0.68 06/08/2020 1039   CALCIUM 9.1 06/08/2020 1039   PROT 6.4 06/08/2020 1039   ALBUMIN 4.5 06/08/2020 1039   AST 12 06/08/2020 1039   ALT 12 06/08/2020 1039   ALKPHOS 50 06/08/2020 1039   BILITOT 0.4 06/08/2020 1039   GFRNONAA 119 06/08/2020 1039   GFRAA 137 06/08/2020 1039   No results found for: "CHOL", "HDL", "LDLCALC", "LDLDIRECT", "TRIG", "CHOLHDL" No results found for: "HGBA1C" Lab Results  Component Value Date   VITAMINB12 379 02/16/2020   Lab Results  Component Value Date   TSH 4.300 03/13/2023   Vit D, 25-Hydroxy  Date Value Ref Range Status  03/01/2020 31.8 30.0 - 100.0 ng/mL Final    Comment:    Vitamin D deficiency has been defined by the Institute of Medicine and an Endocrine Society practice guideline as a level of serum 25-OH vitamin D less than 20 ng/mL (1,2). The Endocrine Society went on to further define vitamin D insufficiency as a level between 21 and 29 ng/mL (2). 1. IOM (Institute of Medicine). 2010. Dietary reference    intakes for calcium and D. [place]: The    [employer]. 2. Holick MF, Binkley , Bischoff-Ferrari HA, et al.    Evaluation, treatment, and prevention of vitamin D    deficiency: an Endocrine Society clinical practice    guideline. JCEM. 2011 Jul; 96(7):1911-30.     01/27/20 CBC, CMP - nl a1c 4.6  02/23/20 ACE, ANA, SSA, SSB, HIV, RPR ANCA negative  02/23/20  Hep C Virus Ab 0.0 - 0.9 s/co ratio 0.1     02/23/20      Hep B Core Total Ab Negative Negative    Hepatitis B Surface Ag Negative Negative    Hep B Surface Ab, Qual Reactive     06/15/21  CBC, CMP -->  normal  02/24/20 MRI brain (with and without) [I reviewed images myself and agree with interpretation. -VRP]  -Multiple periventricular, subcortical and pericallosal T2 hyperintensities, suspicious  for chronic demyelinating disease. -Single left frontal subcortical enhancing lesion measuring 2 mm.  May represent acute demyelinating plaque.  02/18/20 MRI cervical spine (with and without) demonstrating: -T2 hyperintensities in the spinal cord at C3-4 and C5 levels, and within the left middle cerebellar peduncle intracranially.  No abnormal enhancement.  Findings can be seen with autoimmune, inflammatory, demyelinating, postinfectious or vascular etiologies.  02/24/20 MRI thoracic  - Normal MRI thoracic spine (with and without).   02/18/20 MRI lumbar spine  - Unremarkable MRI lumbar spine with and without contrast. No spinal stenosis or foraminal narrowing.   02/01/21  MRI brain with and without contrast demonstrating: -Multiple stable supratentorial and infratentorial chronic demyelinating plaques. -No acute plaques.  No new plaques seen.  02/01/21 MRI cervical spine with and without contrast demonstrating: -Faint chronic demyelinating plaques within the spinal cord at C3 and C5 levels on sagittal views.  These appear similar to slightly improved compared to MRI from 02/18/2020. -No acute plaques.  No new plaques seen.  11/22/22 MRI brain (with and without) demonstrating: -Multiple stable supratentorial and infratentorial chronic demyelinating plaques. -No acute plaques.  No change from 02/01/2021.  11/22/22 MRI cervical spine (with and without) demonstrating: - Stable hazy STIR hyperintense lesions at C3 and C5 levels on sagittal views. - No acute plaques.  No significant change from 02/01/2021.    ASSESSMENT AND PLAN  33 y.o. year old female here with lower extremity weakness, difficulty with urination, numbness in hands and feet since 2021.  Dx:  1. MS (multiple sclerosis) (HCC)      PLAN:  RELASPING REMITTING MULTIPLE SCLEROSIS (LOWER EXTREMITY WEAKNESS / BLADDER SPASM / GAIT DIFFICULTY / HYPERREFLEXIA) - continue ocrevus (March / Sept cycle); labs stable - continue multi-vitamin; vitamin D stable - continue baclofen for spasm  BRAIN FOG / ANXIETY / DEPRESSION / OCD (better on lamotrigine for bipolar) - wean off modafinil (200mg  daily x 1-2 weeks, then stop); to see if palpitations improve - may start vyvanse per psychiatry (ok to overlap)  INTERMITTENT PALPITATIONS, TACHYCARDIA, BRADYCARDIA, SHORTNESS OF BREATH - follow up with cardiology  BLADDER ISSUES - continue tamsulosin (incomplete bladder emptying) - consider urodynamic studies per urology (referred back in 2021)  Orders Placed This Encounter  Procedures   CBC with Differential/Platelet   Immunoglobulins, QN, A/E/G/M   Return in about 6 months (around 12/17/2023) for MyChart visit (15 min).     Suanne Marker, MD 06/19/2023, 4:00 PM Certified in Neurology, Neurophysiology and Neuroimaging  [facility] [address] [place], [place] [address] [phone]

## 2023-06-19 NOTE — Progress Notes (Signed)
Pt cancelled  This encounter was created in error - please disregard. 

## 2023-06-20 ENCOUNTER — Telehealth: Payer: Self-pay | Admitting: Diagnostic Neuroimaging

## 2023-06-20 NOTE — Telephone Encounter (Signed)
Called and advised the patient that Dr Marjory Lies didn't feel comfortable with prescribing the Vyvanse. Pt states that she will remain on the modafinil he prescribes then. She states with her MS she has to have something that helps with the brain fog/fatigue. The modafinil is messing with her heart but she will stay the course for now and continue with modafinil.

## 2023-06-20 NOTE — Telephone Encounter (Signed)
Pt is asking that a message be sent to Dr Marjory Lies asking that he tapers her off the  modafinil (PROVIGIL) 200 MG tablet and then begins managing her Vyvanse. Her psychiatrist is unable to manage that medication for her any longer

## 2023-06-21 LAB — CBC WITH DIFFERENTIAL/PLATELET
Basophils Absolute: 0.1 10*3/uL (ref 0.0–0.2)
Basos: 1 %
EOS (ABSOLUTE): 0.1 10*3/uL (ref 0.0–0.4)
Eos: 1 %
Hematocrit: 40.7 % (ref 34.0–46.6)
Hemoglobin: 13.6 g/dL (ref 11.1–15.9)
Immature Grans (Abs): 0 10*3/uL (ref 0.0–0.1)
Immature Granulocytes: 0 %
Lymphocytes Absolute: 1.9 10*3/uL (ref 0.7–3.1)
Lymphs: 19 %
MCH: 31.2 pg (ref 26.6–33.0)
MCHC: 33.4 g/dL (ref 31.5–35.7)
MCV: 93 fL (ref 79–97)
Monocytes Absolute: 0.5 10*3/uL (ref 0.1–0.9)
Monocytes: 5 %
Neutrophils Absolute: 7.4 10*3/uL — ABNORMAL HIGH (ref 1.4–7.0)
Neutrophils: 74 %
Platelets: 306 10*3/uL (ref 150–450)
RBC: 4.36 x10E6/uL (ref 3.77–5.28)
RDW: 12 % (ref 11.7–15.4)
WBC: 9.9 10*3/uL (ref 3.4–10.8)

## 2023-06-21 LAB — IMMUNOGLOBULINS A/E/G/M, SERUM
IgA/Immunoglobulin A, Serum: 228 mg/dL (ref 87–352)
IgE (Immunoglobulin E), Serum: 18 [IU]/mL (ref 6–495)
IgG (Immunoglobin G), Serum: 737 mg/dL (ref 586–1602)
IgM (Immunoglobulin M), Srm: 49 mg/dL (ref 26–217)

## 2023-06-22 ENCOUNTER — Ambulatory Visit: Payer: Self-pay | Admitting: Cardiovascular Disease

## 2023-06-28 NOTE — Telephone Encounter (Signed)
 Per Dr Marjory Lies wanted to fax the recent ov notes over to [facility]  [facility]. Fax number I found looking it up was  [phone] Received confirmation that fax went through.

## 2023-07-01 ENCOUNTER — Other Ambulatory Visit: Payer: Self-pay | Admitting: Diagnostic Neuroimaging

## 2023-07-01 DIAGNOSIS — M6281 Muscle weakness (generalized): Secondary | ICD-10-CM

## 2023-07-01 DIAGNOSIS — G35 Multiple sclerosis: Secondary | ICD-10-CM

## 2023-07-02 NOTE — Telephone Encounter (Signed)
 Last seen on 06/19/23 Follow up scheduled on 12/17/23  Per telephone encounter on 06/20/23 pt will continue modafinil Last filled on 05/30/23 #60 tablets (30 day supply) Rx pending to be signed

## 2023-07-05 NOTE — Progress Notes (Signed)
 Lab results are good, no major findings. Continue current plan. -VRP

## 2023-07-18 DIAGNOSIS — Z6823 Body mass index (BMI) 23.0-23.9, adult: Secondary | ICD-10-CM | POA: Diagnosis not present

## 2023-07-18 DIAGNOSIS — R110 Nausea: Secondary | ICD-10-CM | POA: Diagnosis not present

## 2023-07-18 DIAGNOSIS — J0140 Acute pansinusitis, unspecified: Secondary | ICD-10-CM | POA: Diagnosis not present

## 2023-07-18 DIAGNOSIS — M791 Myalgia, unspecified site: Secondary | ICD-10-CM | POA: Diagnosis not present

## 2023-07-18 DIAGNOSIS — R0981 Nasal congestion: Secondary | ICD-10-CM | POA: Diagnosis not present

## 2023-07-25 ENCOUNTER — Telehealth: Payer: Self-pay | Admitting: Diagnostic Neuroimaging

## 2023-07-25 NOTE — Telephone Encounter (Signed)
 Pt said Infusion scheduled tomorrow has been pushed back due to insurance issues. Want to know if Dr. Marjory Lies can prescribe a Solunedrol infusion or prescribe steroid . Would like a call back.

## 2023-08-02 DIAGNOSIS — G35 Multiple sclerosis: Secondary | ICD-10-CM | POA: Diagnosis not present

## 2023-08-27 ENCOUNTER — Telehealth: Payer: Self-pay

## 2023-08-27 ENCOUNTER — Other Ambulatory Visit (HOSPITAL_COMMUNITY): Payer: Self-pay

## 2023-08-27 NOTE — Telephone Encounter (Signed)
 Pharmacy Patient Advocate Encounter  Received notification from [facility] that Prior Authorization for Modafinil  200MG  tablets has been APPROVED from 08/27/2023 to 08/26/2024. Ran test claim, Copay is $25.00. This test claim was processed through [facility]- copay amounts may vary at other pharmacies due to pharmacy/plan contracts, or as the patient moves through the different stages of their insurance plan.   PA #/Case ID/Reference #: PA Case ID #: [id number]

## 2023-08-28 DIAGNOSIS — M9903 Segmental and somatic dysfunction of lumbar region: Secondary | ICD-10-CM | POA: Diagnosis not present

## 2023-08-28 DIAGNOSIS — M9904 Segmental and somatic dysfunction of sacral region: Secondary | ICD-10-CM | POA: Diagnosis not present

## 2023-08-28 DIAGNOSIS — M5417 Radiculopathy, lumbosacral region: Secondary | ICD-10-CM | POA: Diagnosis not present

## 2023-08-28 DIAGNOSIS — M9901 Segmental and somatic dysfunction of cervical region: Secondary | ICD-10-CM | POA: Diagnosis not present

## 2023-08-28 DIAGNOSIS — M9902 Segmental and somatic dysfunction of thoracic region: Secondary | ICD-10-CM | POA: Diagnosis not present

## 2023-10-04 DIAGNOSIS — F411 Generalized anxiety disorder: Secondary | ICD-10-CM | POA: Diagnosis not present

## 2023-10-04 DIAGNOSIS — F422 Mixed obsessional thoughts and acts: Secondary | ICD-10-CM | POA: Diagnosis not present

## 2023-10-04 DIAGNOSIS — F3181 Bipolar II disorder: Secondary | ICD-10-CM | POA: Diagnosis not present

## 2023-11-01 ENCOUNTER — Telehealth: Payer: Self-pay

## 2023-11-01 NOTE — Telephone Encounter (Signed)
 Notification received that this patient has [place] and will no longer cover the drug Ocrevus  through buy and bill. Last infusion 3/27, next one due is 9/25 Please advise

## 2023-11-15 DIAGNOSIS — F3181 Bipolar II disorder: Secondary | ICD-10-CM | POA: Diagnosis not present

## 2023-11-15 DIAGNOSIS — F902 Attention-deficit hyperactivity disorder, combined type: Secondary | ICD-10-CM | POA: Diagnosis not present

## 2023-11-15 DIAGNOSIS — F411 Generalized anxiety disorder: Secondary | ICD-10-CM | POA: Diagnosis not present

## 2023-11-15 DIAGNOSIS — F422 Mixed obsessional thoughts and acts: Secondary | ICD-10-CM | POA: Diagnosis not present

## 2023-12-17 ENCOUNTER — Telehealth (INDEPENDENT_AMBULATORY_CARE_PROVIDER_SITE_OTHER): Payer: Self-pay | Admitting: Diagnostic Neuroimaging

## 2023-12-17 ENCOUNTER — Encounter: Payer: Self-pay | Admitting: Diagnostic Neuroimaging

## 2023-12-17 DIAGNOSIS — G35 Multiple sclerosis: Secondary | ICD-10-CM

## 2023-12-17 MED ORDER — PREDNISONE 10 MG PO TABS: ORAL_TABLET | ORAL | 0 refills | Status: AC

## 2023-12-17 NOTE — Progress Notes (Signed)
 [facility]  PATIENT: Paige Davis DOB: 03-14-1991  REFERRING CLINICIAN: [facility] HISTORY FROM: patient  REASON FOR VISIT: follow up    HISTORICAL  CHIEF COMPLAINT:  Chief Complaint  Patient presents with   MS follow up    HISTORY OF PRESENT ILLNESS:   UPDATE (12/17/23, VRP): Since last visit, doing well until 3 weeks ago. Then more bladder issues and hand numbness. Also with some eye issues. Possible wearing off of ocrevus .   UPDATE (06/19/23, VRP): Since last visit, doing well! Tolerating ocrevus . Interested in changing modafinil  to vyvanse per psychiatry to help adhd and reduce palpitations. Overall balance and coordination better. Also now on lamotrigine for bipolar and sxs are better.   UPDATE (12/18/22, VRP): Since last visit, more anxiety, brain fog issues. Some GI and heart symptoms lately. Needs new PCP. Tolerating meds.   UPDATE (06/21/22, VRP): Since last visit, doing well. Mood, energy, brain fog are improved. No alleviating or aggravating factors. Tolerating meds.  UPDATE (07/13/21, VRP): Since last visit, had 2nd opinion at [facility]. Plan recs to stay the same. Still with more fatigue. Sleeping 7-8 hours at night. Spasms controlled with baclofen . Tolerating ocrevus .   UPDATE (01/12/21, VRP): Since last visit, doing poorly; intermittent crossed vision with heat; more anxiety, diff focusing. Modafinil  not helping. OCT testing showed some decreased in RNFL (88um). She is concerned about her prognosis. Tolerating ocrevus . Bladder still affected.   UPDATE (07/07/20, VRP): Since last visit, doing much better since starting Ocrevus  and getting Solu-Medrol.  Still has some fatigue, brain fog, twitching sensations.  Sometimes when she moves her eyes rapidly she feels slight blurred or crossed vision.  She has stopped smoking.  She is on Wellbutrin doing well.   UPDATE (03/10/20, VRP): Since last visit, now diagnosed with multiple sclerosis.  MRI and  lab testing reviewed.  Planning to initiate Ocrevus .  Patient continues to have issues with fatigue, gait difficulty, incomplete bladder emptying.   PRIOR HPI (02/16/20): 33 year old female here for evaluation of lower extremity weakness.  March 2021 patient was having some difficulty initiating urination, diagnosed with UTIs and treated. She was also having generalized fatigue, muscle weakness in legs, aching sensation in legs. Patient got married in April 2021 and symptoms continued. She was having continuing weakness in the legs and gait difficulty. She was having intermittent numbness in fingers and toes.  Patient went to chiropractor and had some treatments. Patient referred here for further neurologic testing and evaluation.   REVIEW OF SYSTEMS: Full 14 system review of systems performed and negative with exception of: As per HPI.  ALLERGIES: No Known Allergies  HOME MEDICATIONS: Outpatient Medications Prior to Visit  Medication Sig Dispense Refill   ALPRAZolam (XANAX) 0.5 MG tablet Take 0.5 mg by mouth as needed for anxiety.     baclofen  (LIORESAL ) 10 MG tablet Take 1 tablet (10 mg total) by mouth 2 (two) times daily. 180 tablet 4   lamoTRIgine (LAMICTAL) 25 MG tablet Take 25 mg by mouth 2 (two) times daily.     modafinil  (PROVIGIL ) 200 MG tablet TAKE 1 TABLET BY MOUTH TWICE A DAY 60 tablet 5   ocrelizumab  600 mg in sodium chloride 0.9 % 500 mL Inject 600 mg into the vein every 6 (six) months.     tamsulosin  (FLOMAX ) 0.4 MG CAPS capsule TAKE 1 CAPSULE BY MOUTH EVERY DAY 90 capsule 1   No facility-administered medications prior to visit.    PAST MEDICAL HISTORY: Past Medical History:  Diagnosis  Date   Anxiety    Asthma    Bladder spasms 07/2019   Multiple sclerosis (HCC)    OCD (obsessive compulsive disorder)    Scoliosis     PAST SURGICAL HISTORY: Past Surgical History:  Procedure Laterality Date   BREAST ENHANCEMENT SURGERY  2012    FAMILY HISTORY: Family History   Problem Relation Age of Onset   Arthritis Mother    Multiple sclerosis Maternal Uncle     SOCIAL HISTORY: Social History   Socioeconomic History   Marital status: Married    Spouse name: Redell   Number of children: 0   Years of education: Not on file   Highest education level: GED or equivalent  Occupational History    Comment: real estate agent  Tobacco Use   Smoking status: Former    Current packs/day: 0.00    Average packs/day: 1 pack/day for 10.0 years (10.0 ttl pk-yrs)    Types: Cigarettes    Start date: 2009    Quit date: 2019    Years since quitting: 6.6   Smokeless tobacco: Never  Vaping Use   Vaping status: Every Day   Substances: THC  Substance and Sexual Activity   Alcohol use: Yes    Alcohol/week: 2.0 standard drinks of alcohol    Types: 2 Glasses of wine per week    Comment: occas   Drug use: Yes    Types: Marijuana    Comment: 02/16/20 THC daily   Sexual activity: Yes    Comment: Husband had vasectomy  Other Topics Concern   Not on file  Social History Narrative   Lives with husband, step children   Caffeine- not daily   Social Drivers of Corporate investment banker Strain: Not on file  Food Insecurity: Not on file  Transportation Needs: Not on file  Physical Activity: Not on file  Stress: Not on file  Social Connections: Unknown (09/06/2021)   Received from [facility]   Social Network    Social Network: Not on file  Intimate Partner Violence: Unknown (08/10/2021)   Received from [facility]   HITS    Physically Hurt: Not on file    Insult or Talk Down To: Not on file    Threaten Physical Harm: Not on file    Scream or Curse: Not on file     PHYSICAL EXAM  GENERAL EXAM/CONSTITUTIONAL: Vitals:  There were no vitals filed for this visit.  There is no height or weight on file to calculate BMI. Wt Readings from Last 3 Encounters:  06/19/23 157 lb (71.2 kg)  03/13/23 153 lb 3.2 oz (69.5 kg)  12/18/22 153 lb (69.4 kg)   Patient  is in no distress; well developed, nourished and groomed; neck is supple  CARDIOVASCULAR: Examination of carotid arteries is normal; no carotid bruits Regular rate and rhythm, no murmurs Examination of peripheral vascular system by observation and palpation is normal  EYES: Ophthalmoscopic exam of optic discs and posterior segments is normal; no papilledema or hemorrhages No results found.  MUSCULOSKELETAL: Gait, strength, tone, movements noted in Neurologic exam below  NEUROLOGIC: MENTAL STATUS:     12/18/2022    1:48 PM  MMSE - Mini Mental State Exam  Orientation to time 4  Orientation to Place 5  Registration 3  Attention/ Calculation 4  Recall 2  Language- name 2 objects 2  Language- repeat 1  Language- follow 3 step command 3  Language- read & follow direction 1  Write a sentence  1  Copy design 1  Total score 27   awake, alert, oriented to person, place and time recent and remote memory intact normal attention and concentration language fluent, comprehension intact, naming intact fund of knowledge appropriate  CRANIAL NERVE:  2nd - no papilledema on fundoscopic exam 2nd, 3rd, 4th, 6th - pupils equal and reactive to light, visual fields full to confrontation, extraocular muscles intact, no nystagmus 5th - facial sensation symmetric 7th - facial strength symmetric 8th - hearing intact 9th - palate elevates symmetrically, uvula midline 11th - shoulder shrug symmetric 12th - tongue protrusion midline  MOTOR:  normal bulk and tone, full strength in the BUE, BLE  SENSORY:  normal and symmetric to light touch  COORDINATION:  finger-nose-finger, fine finger movements normal  REFLEXES:  deep tendon reflexes present and symmetric  GAIT/STATION:  narrow based gait    DIAGNOSTIC DATA (LABS, IMAGING, TESTING) - I reviewed patient records, labs, notes, testing and imaging myself where available.  Lab Results  Component Value Date   WBC 9.9 06/19/2023    HGB 13.6 06/19/2023   HCT 40.7 06/19/2023   MCV 93 06/19/2023   PLT 306 06/19/2023      Component Value Date/Time   NA 141 06/08/2020 1039   K 4.2 06/08/2020 1039   CL 104 06/08/2020 1039   CO2 24 06/08/2020 1039   GLUCOSE 74 06/08/2020 1039   GLUCOSE 87 01/27/2011 0410   BUN 9 06/08/2020 1039   CREATININE 0.68 06/08/2020 1039   CALCIUM 9.1 06/08/2020 1039   PROT 6.4 06/08/2020 1039   ALBUMIN 4.5 06/08/2020 1039   AST 12 06/08/2020 1039   ALT 12 06/08/2020 1039   ALKPHOS 50 06/08/2020 1039   BILITOT 0.4 06/08/2020 1039   GFRNONAA 119 06/08/2020 1039   GFRAA 137 06/08/2020 1039   No results found for: CHOL, HDL, LDLCALC, LDLDIRECT, TRIG, CHOLHDL No results found for: YHAJ8R Lab Results  Component Value Date   VITAMINB12 379 02/16/2020   Lab Results  Component Value Date   TSH 4.300 03/13/2023   Vit D, 25-Hydroxy  Date Value Ref Range Status  03/01/2020 31.8 30.0 - 100.0 ng/mL Final    Comment:    Vitamin D deficiency has been defined by the Institute of Medicine and an Endocrine Society practice guideline as a level of serum 25-OH vitamin D less than 20 ng/mL (1,2). The Endocrine Society went on to further define vitamin D insufficiency as a level between 21 and 29 ng/mL (2). 1. IOM (Institute of Medicine). 2010. Dietary reference    intakes for calcium and D. [place]: The    [employer]. 2. Holick MF, Binkley Sanborn, Bischoff-Ferrari HA, et al.    Evaluation, treatment, and prevention of vitamin D    deficiency: an Endocrine Society clinical practice    guideline. JCEM. 2011 Jul; 96(7):1911-30.     01/27/20 CBC, CMP - nl a1c 4.6  02/23/20 ACE, ANA, SSA, SSB, HIV, RPR ANCA negative  02/23/20  Hep C Virus Ab 0.0 - 0.9 s/co ratio 0.1     02/23/20      Hep B Core Total Ab Negative Negative    Hepatitis B Surface Ag Negative Negative    Hep B Surface Ab, Qual Reactive     06/15/21  CBC, CMP --> normal  02/24/20 MRI brain  (with and without) [I reviewed images myself and agree with interpretation. -VRP]  -Multiple periventricular, subcortical and pericallosal T2 hyperintensities, suspicious for chronic demyelinating disease. -Single left frontal  subcortical enhancing lesion measuring 2 mm.  May represent acute demyelinating plaque.  02/18/20 MRI cervical spine (with and without) demonstrating: -T2 hyperintensities in the spinal cord at C3-4 and C5 levels, and within the left middle cerebellar peduncle intracranially.  No abnormal enhancement.  Findings can be seen with autoimmune, inflammatory, demyelinating, postinfectious or vascular etiologies.  02/24/20 MRI thoracic  - Normal MRI thoracic spine (with and without).   02/18/20 MRI lumbar spine  - Unremarkable MRI lumbar spine with and without contrast. No spinal stenosis or foraminal narrowing.   02/01/21  MRI brain with and without contrast demonstrating: -Multiple stable supratentorial and infratentorial chronic demyelinating plaques. -No acute plaques.  No new plaques seen.  02/01/21 MRI cervical spine with and without contrast demonstrating: -Faint chronic demyelinating plaques within the spinal cord at C3 and C5 levels on sagittal views.  These appear similar to slightly improved compared to MRI from 02/18/2020. -No acute plaques.  No new plaques seen.  11/22/22 MRI brain (with and without) demonstrating: -Multiple stable supratentorial and infratentorial chronic demyelinating plaques. -No acute plaques.  No change from 02/01/2021.  11/22/22 MRI cervical spine (with and without) demonstrating: - Stable hazy STIR hyperintense lesions at C3 and C5 levels on sagittal views. - No acute plaques.  No significant change from 02/01/2021.    ASSESSMENT AND PLAN  33 y.o. year old female here with lower extremity weakness, difficulty with urination, numbness in hands and feet since 2021.  Dx:  1. MS (multiple sclerosis) (HCC)      PLAN:  RELASPING  REMITTING MULTIPLE SCLEROSIS (LOWER EXTREMITY WEAKNESS / BLADDER SPASM / GAIT DIFFICULTY / HYPERREFLEXIA) - consider switching to briumvi (due to possible wearing off of ocrevus ; now with new sxs x 3 weeks) - check CBC, immunoglobulin panel - check MRI scans (due to new breakthrough sxs) - continue multi-vitamin; vitamin D stable - continue baclofen  for spasm  BRAIN FOG / ANXIETY / DEPRESSION / OCD (better on lamotrigine for bipolar) - continue modafinil  200mg  daily   INTERMITTENT PALPITATIONS, TACHYCARDIA, BRADYCARDIA, SHORTNESS OF BREATH - follow up with cardiology  BLADDER ISSUES - continue tamsulosin  (incomplete bladder emptying) - consider urodynamic studies per urology (referred back in 2021)  Orders Placed This Encounter  Procedures   MR BRAIN W WO CONTRAST   MR CERVICAL SPINE W WO CONTRAST   MR THORACIC SPINE W WO CONTRAST   CBC with Differential/Platelet   Immunoglobulins, QN, A/E/G/M   Meds ordered this encounter  Medications   predniSONE  (DELTASONE ) 10 MG tablet    Sig: Take 60mg  on day 1. Reduce by 10mg  each subsequent day. (60, 50, 40, 30, 20, 10, stop)    Dispense:  21 tablet    Refill:  0   Return in about 6 months (around 06/18/2024).  Virtual Visit via Video Note  I connected with Paige Davis on 12/17/2023 at  2:30 PM EDT by a video enabled telemedicine application and verified that I am speaking with the correct person using two identifiers.   I discussed the limitations of evaluation and management by telemedicine and the availability of in person appointments. The patient expressed understanding and agreed to proceed.  Patient is at home and I am at the office.   I spent 20 minutes of face-to-face and non-face-to-face time with patient.  This included previsit chart review, lab review, study review, order entry, electronic health record documentation, patient education.      EDUARD FABIENE HANLON, MD 12/17/2023, 3:15 PM Certified in Neurology,  Neurophysiology and  Neuroimaging  [facility] [address], [address] [place], [place] [address] [phone]

## 2023-12-26 ENCOUNTER — Other Ambulatory Visit: Payer: Self-pay

## 2023-12-27 ENCOUNTER — Other Ambulatory Visit (INDEPENDENT_AMBULATORY_CARE_PROVIDER_SITE_OTHER): Payer: Self-pay

## 2023-12-27 DIAGNOSIS — Z0289 Encounter for other administrative examinations: Secondary | ICD-10-CM

## 2023-12-27 DIAGNOSIS — G35 Multiple sclerosis: Secondary | ICD-10-CM | POA: Diagnosis not present

## 2023-12-28 ENCOUNTER — Encounter: Payer: Self-pay | Admitting: Diagnostic Neuroimaging

## 2023-12-29 ENCOUNTER — Other Ambulatory Visit: Payer: Self-pay | Admitting: Diagnostic Neuroimaging

## 2023-12-31 LAB — CBC WITH DIFFERENTIAL/PLATELET
Basophils Absolute: 0.1 x10E3/uL (ref 0.0–0.2)
Basos: 1 %
EOS (ABSOLUTE): 0.1 x10E3/uL (ref 0.0–0.4)
Eos: 1 %
Hematocrit: 41.5 % (ref 34.0–46.6)
Hemoglobin: 13.6 g/dL (ref 11.1–15.9)
Immature Grans (Abs): 0 x10E3/uL (ref 0.0–0.1)
Immature Granulocytes: 0 %
Lymphocytes Absolute: 1.9 x10E3/uL (ref 0.7–3.1)
Lymphs: 22 %
MCH: 30.8 pg (ref 26.6–33.0)
MCHC: 32.8 g/dL (ref 31.5–35.7)
MCV: 94 fL (ref 79–97)
Monocytes Absolute: 0.6 x10E3/uL (ref 0.1–0.9)
Monocytes: 6 %
Neutrophils Absolute: 6.2 x10E3/uL (ref 1.4–7.0)
Neutrophils: 70 %
Platelets: 310 x10E3/uL (ref 150–450)
RBC: 4.42 x10E6/uL (ref 3.77–5.28)
RDW: 11.8 % (ref 11.7–15.4)
WBC: 8.9 x10E3/uL (ref 3.4–10.8)

## 2023-12-31 LAB — IMMUNOGLOBULINS A/E/G/M, SERUM
IgA/Immunoglobulin A, Serum: 211 mg/dL (ref 87–352)
IgE (Immunoglobulin E), Serum: 13 [IU]/mL (ref 6–495)
IgG (Immunoglobin G), Serum: 716 mg/dL (ref 586–1602)
IgM (Immunoglobulin M), Srm: 50 mg/dL (ref 26–217)

## 2024-01-01 ENCOUNTER — Other Ambulatory Visit: Payer: Self-pay

## 2024-01-02 ENCOUNTER — Ambulatory Visit: Payer: Self-pay | Admitting: Diagnostic Neuroimaging

## 2024-01-02 MED ORDER — OXYBUTYNIN CHLORIDE ER 5 MG PO TB24: 5.0000 mg | ORAL_TABLET | Freq: Every day | ORAL | 6 refills | Status: DC

## 2024-01-02 NOTE — Telephone Encounter (Signed)
 I called patient and discussed medication.  Will try to get Briumvi approved.  Also switched tamsulosin  to oxybutynin .  She will try this and monitor symptoms.  Also may need follow-up with urology.  Meds ordered this encounter  Medications   oxybutynin  (DITROPAN -XL) 5 MG 24 hr tablet    Sig: Take 1 tablet (5 mg total) by mouth at bedtime.    Dispense:  30 tablet    Refill:  6    EDUARD FABIENE HANLON, MD 01/02/2024, 3:52 PM Certified in Neurology, Neurophysiology and Neuroimaging  [facility] [address], [address] [place], [place] [address] [phone]

## 2024-01-02 NOTE — Addendum Note (Signed)
 Addended by: MARGARET EDUARD SAUNDERS on: 01/02/2024 03:53 PM   Modules accepted: Orders

## 2024-01-03 ENCOUNTER — Telehealth: Payer: Self-pay | Admitting: Neurology

## 2024-01-03 NOTE — Telephone Encounter (Signed)
 Pt has agreed to start the process for a Briumvi to see about coverage. I have sent the start form to briumvi pt assistance and received confirmation that it was received.  I have provided the order and start form to [facility].

## 2024-01-05 ENCOUNTER — Other Ambulatory Visit: Payer: Self-pay | Admitting: Diagnostic Neuroimaging

## 2024-01-05 DIAGNOSIS — G35 Multiple sclerosis: Secondary | ICD-10-CM

## 2024-01-05 DIAGNOSIS — M6281 Muscle weakness (generalized): Secondary | ICD-10-CM

## 2024-01-08 ENCOUNTER — Other Ambulatory Visit: Payer: Self-pay

## 2024-01-08 MED ORDER — OXYBUTYNIN CHLORIDE ER 10 MG PO TB24: 10.0000 mg | ORAL_TABLET | Freq: Every day | ORAL | 6 refills | Status: AC

## 2024-01-08 NOTE — Addendum Note (Signed)
 Addended by: MARGARET CARNE R on: 01/08/2024 02:13 PM   Modules accepted: Orders

## 2024-01-09 NOTE — Telephone Encounter (Signed)
 Last seen 12/17/23 and last refilled 12/04/23 #60.

## 2024-01-21 NOTE — Telephone Encounter (Signed)
 I have reached out to [facility] in [place] for an update on this case.

## 2024-01-21 NOTE — Telephone Encounter (Signed)
 Per [place] in the infusion suite: She just got her Insurance restarted on Thursday.SABRA

## 2024-02-04 DIAGNOSIS — G35 Multiple sclerosis: Secondary | ICD-10-CM | POA: Diagnosis not present

## 2024-02-12 NOTE — Telephone Encounter (Signed)
 Patient started Briumvi on 02/04/2024.

## 2024-02-18 ENCOUNTER — Encounter: Payer: Self-pay | Admitting: Diagnostic Neuroimaging

## 2024-02-18 DIAGNOSIS — G35A Relapsing-remitting multiple sclerosis: Secondary | ICD-10-CM | POA: Diagnosis not present

## 2024-02-19 NOTE — Telephone Encounter (Signed)
 Signed parking application placed in mail to pt.

## 2024-02-19 NOTE — Telephone Encounter (Signed)
Form in process.

## 2024-03-26 ENCOUNTER — Encounter: Payer: Self-pay | Admitting: Diagnostic Neuroimaging
